# Patient Record
Sex: Female | Born: 1990 | Hispanic: No | Marital: Single
Health system: Southern US, Community
[De-identification: ages and names within clinical notes are randomized; demographics above are authoritative.]

## PROBLEM LIST (undated history)

## (undated) DIAGNOSIS — K219 Gastro-esophageal reflux disease without esophagitis: Secondary | ICD-10-CM

## (undated) DIAGNOSIS — Z8711 Personal history of peptic ulcer disease: Secondary | ICD-10-CM

## (undated) DIAGNOSIS — E559 Vitamin D deficiency, unspecified: Secondary | ICD-10-CM

## (undated) DIAGNOSIS — M549 Dorsalgia, unspecified: Secondary | ICD-10-CM

## (undated) DIAGNOSIS — F419 Anxiety disorder, unspecified: Secondary | ICD-10-CM

## (undated) DIAGNOSIS — E538 Deficiency of other specified B group vitamins: Secondary | ICD-10-CM

## (undated) HISTORY — DX: Anxiety disorder, unspecified: F41.9

## (undated) HISTORY — DX: Personal history of peptic ulcer disease: Z87.11

## (undated) HISTORY — DX: Vitamin D deficiency, unspecified: E55.9

## (undated) HISTORY — DX: Dorsalgia, unspecified: M54.9

## (undated) HISTORY — DX: Gastro-esophageal reflux disease without esophagitis: K21.9

## (undated) HISTORY — DX: Deficiency of other specified B group vitamins: E53.8

---
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
## 2010-05-10 ENCOUNTER — Encounter: Admission: RE | Admit: 2010-05-10 | Discharge: 2010-05-10 | Payer: Self-pay | Admitting: Family Medicine

## 2015-10-31 ENCOUNTER — Other Ambulatory Visit (HOSPITAL_COMMUNITY)
Admission: RE | Admit: 2015-10-31 | Discharge: 2015-10-31 | Disposition: A | Discharge status: Home / Self Care | Payer: Managed Care, Other (non HMO) | Source: Ambulatory Visit | Attending: Obstetrics and Gynecology | Admitting: Obstetrics and Gynecology

## 2015-10-31 ENCOUNTER — Other Ambulatory Visit: Payer: Self-pay | Admitting: Obstetrics and Gynecology

## 2015-10-31 DIAGNOSIS — Z01419 Encounter for gynecological examination (general) (routine) without abnormal findings: Secondary | ICD-10-CM | POA: Diagnosis not present

## 2015-11-07 LAB — CYTOLOGY - PAP

## 2017-07-13 ENCOUNTER — Other Ambulatory Visit: Payer: Self-pay | Admitting: Obstetrics and Gynecology

## 2017-07-13 ENCOUNTER — Other Ambulatory Visit (HOSPITAL_COMMUNITY)
Admission: RE | Admit: 2017-07-13 | Discharge: 2017-07-13 | Disposition: A | Discharge status: Home / Self Care | Payer: 59 | Source: Ambulatory Visit | Attending: Obstetrics and Gynecology | Admitting: Obstetrics and Gynecology

## 2017-07-13 DIAGNOSIS — Z01419 Encounter for gynecological examination (general) (routine) without abnormal findings: Secondary | ICD-10-CM | POA: Diagnosis present

## 2017-07-15 LAB — CYTOLOGY - PAP: Diagnosis: NEGATIVE

## 2020-03-14 ENCOUNTER — Ambulatory Visit: Payer: Self-pay | Attending: Internal Medicine

## 2020-03-16 ENCOUNTER — Ambulatory Visit: Admission: EM | Admit: 2020-03-16 | Discharge: 2020-03-16 | Discharge status: Absent without leave | Payer: Self-pay

## 2020-03-16 ENCOUNTER — Ambulatory Visit
Admission: EM | Admit: 2020-03-16 | Discharge: 2020-03-16 | Disposition: A | Discharge status: Home / Self Care | Payer: Federal, State, Local not specified - PPO

## 2020-03-16 ENCOUNTER — Encounter: Payer: Self-pay | Admitting: Emergency Medicine

## 2020-03-16 ENCOUNTER — Other Ambulatory Visit: Payer: Self-pay

## 2020-03-16 DIAGNOSIS — S00451A Superficial foreign body of right ear, initial encounter: Secondary | ICD-10-CM | POA: Diagnosis not present

## 2020-03-16 NOTE — ED Triage Notes (Signed)
Pt has earring stuck in right ear since last night.

## 2020-03-16 NOTE — Discharge Instructions (Signed)
Earring removed from LT ear lobe Wash with warm water and mild soap Bandage applied Use OTC ibuprofen and/ or tylenol as needed for pain control Follow up with PCP if symptoms persists Return here or go to the ER if you have any new or worsening symptoms fever, chills, nausea, vomiting, redness, swelling, discharge, etc..Marland Kitchen

## 2020-03-16 NOTE — ED Provider Notes (Signed)
[facility]   [id number] 03/16/20 Arrival Time: 8338  CC: EAR PAIN  SUBJECTIVE: History from: patient.  Tracie Newman is a 29 y.o. female who presents with of RT ear lobe pain and swelling x 1 day.  Got ears pierced 1 week ago.  Patient has tried removing earring at home without relief.  Symptoms are made worse with lying down.  Reports similar symptoms in the past and had to remove earring.    Denies fever, chills, fatigue, ear discharge, chest pain, nausea, changes in bowel or bladder habits.    ROS: As per HPI.  All other pertinent ROS negative.     History reviewed. No pertinent past medical history. History reviewed. No pertinent surgical history. No Known Allergies No current facility-administered medications on file prior to encounter.   Current Outpatient Medications on File Prior to Encounter  Medication Sig Dispense Refill  . cetirizine (ZYRTEC) 10 MG chewable tablet Chew 10 mg by mouth daily.    . sertraline (ZOLOFT) 100 MG tablet Take 100 mg by mouth daily.     Social History   Socioeconomic History  . Marital status: Single    Spouse name: Not on file  . Number of children: Not on file  . Years of education: Not on file  . Highest education level: Not on file  Occupational History  . Not on file  Tobacco Use  . Smoking status: Not on file  Substance and Sexual Activity  . Alcohol use: Not on file  . Drug use: Not on file  . Sexual activity: Not on file  Other Topics Concern  . Not on file  Social History Narrative  . Not on file   Social Determinants of Health   Financial Resource Strain:   . Difficulty of Paying Living Expenses:   Food Insecurity:   . Worried About Programme researcher, broadcasting/film/video in the Last Year:   . Barista in the Last Year:   Transportation Needs:   . Freight forwarder (Medical):   Marland Kitchen Lack of Transportation (Non-Medical):   Physical Activity:   . Days of Exercise per Week:   . Minutes of Exercise per Session:     Stress:   . Feeling of Stress :   Social Connections:   . Frequency of Communication with Friends and Family:   . Frequency of Social Gatherings with Friends and Family:   . Attends Religious Services:   . Active Member of Clubs or Organizations:   . Attends Banker Meetings:   Marland Kitchen Marital Status:   Intimate Partner Violence:   . Fear of Current or Ex-Partner:   . Emotionally Abused:   Marland Kitchen Physically Abused:   . Sexually Abused:    History reviewed. No pertinent family history.  OBJECTIVE:  Vitals:   03/16/20 0830  BP: 126/85  Pulse: 79  Resp: 16  Temp: 98 F (36.7 C)  SpO2: 96%    General appearance: alert; well-appearing, nontoxic; speaking in full sentences and tolerating own secretions HEENT: NCAT; Ears: Small metal stud earring with surrounding swelling to LT ear lobe, TTP, no obvious erythema; Eyes: EOM grossly intact Neck: supple Lungs: normal respiratory effort  Heart: regular rate and rhythm.  Skin: warm and dry Psychological: alert and cooperative; normal mood and affect   ASSESSMENT & PLAN:  1. Foreign body of right ear lobe, initial encounter    Earring removed from LT ear lobe Wash with warm water and mild soap Bandage applied  Use OTC ibuprofen and/ or tylenol as needed for pain control Follow up with PCP if symptoms persists Return here or go to the ER if you have any new or worsening symptoms fever, chills, nausea, vomiting, redness, swelling, discharge, etc...  Reviewed expectations re: course of current medical issues. Questions answered. Outlined signs and symptoms indicating need for more acute intervention. Patient verbalized understanding. After Visit Summary given.         Stacey Drain Navarino, PA-C 03/16/20 [phone]

## 2020-03-19 ENCOUNTER — Other Ambulatory Visit: Payer: Self-pay

## 2020-03-19 ENCOUNTER — Ambulatory Visit: Admission: EM | Admit: 2020-03-19 | Discharge: 2020-03-19 | Discharge status: Absent without leave | Payer: Self-pay

## 2020-03-19 ENCOUNTER — Ambulatory Visit
Admission: EM | Admit: 2020-03-19 | Discharge: 2020-03-19 | Disposition: A | Discharge status: Home / Self Care | Payer: Federal, State, Local not specified - PPO

## 2020-03-19 DIAGNOSIS — H9202 Otalgia, left ear: Secondary | ICD-10-CM

## 2020-03-19 DIAGNOSIS — T162XXA Foreign body in left ear, initial encounter: Secondary | ICD-10-CM | POA: Diagnosis not present

## 2020-03-19 NOTE — ED Triage Notes (Signed)
Pt has earring stuck in left ear from recent piercing. Had same on right ear 3 days ago.

## 2020-03-19 NOTE — ED Provider Notes (Signed)
[facility]    CSN: [id number] Arrival date & time: 03/19/20  0810      History   Chief Complaint Chief Complaint  Patient presents with  . Foreign Body in Skin    HPI Tracie Newman is a 29 y.o. female.   Who presented to the urgent care with a complaint of left earlobe pain and swelling for the past 3 to 4 days.  Got earpiece 9 days ago.  Patient has tried removing the earpiece at home without relief.  Symptoms are made worse with lying down on the left side.  Reports similar symptoms in the past and had ear piece removed.  Denies chills, fever, nausea, vomiting, diarrhea.  The history is provided by the patient. No language interpreter was used.    No past medical history on file.  There are no problems to display for this patient.   No past surgical history on file.  OB History   No obstetric history on file.      Home Medications    Prior to Admission medications   Medication Sig Start Date End Date Taking? Authorizing Provider  cetirizine (ZYRTEC) 10 MG chewable tablet Chew 10 mg by mouth daily.    [provider]  sertraline (ZOLOFT) 100 MG tablet Take 100 mg by mouth daily.    [provider]    Family History No family history on file.  Social History Social History   Tobacco Use  . Smoking status: Not on file  Substance Use Topics  . Alcohol use: Not on file  . Drug use: Not on file     Allergies   Patient has no known allergies.   Review of Systems Review of Systems  Respiratory: Negative.   Cardiovascular: Negative.   Skin: Positive for color change.       Part of hearing lodge inside left ear lobe  All other systems reviewed and are negative.    Physical Exam Triage Vital Signs ED Triage Vitals [03/19/20 0818]  Enc Vitals Group     BP 137/82     Pulse Rate 80     Resp 16     Temp 97.9 F (36.6 C)     Temp src      SpO2 98 %     Weight      Height      Head Circumference      Peak Flow      Pain Score 4     Pain Loc      Pain Edu?      Excl. in [place]?    No data found.  Updated Vital Signs BP 137/82   Pulse 80   Temp 97.9 F (36.6 C)   Resp 16   LMP 03/05/2020   SpO2 98%   Visual Acuity Right Eye Distance:   Left Eye Distance:   Bilateral Distance:    Right Eye Near:   Left Eye Near:    Bilateral Near:     Physical Exam Vitals and nursing note reviewed.  Constitutional:      General: She is not in acute distress.    Appearance: Normal appearance. She is normal weight. She is not ill-appearing, toxic-appearing or diaphoretic.  Cardiovascular:     Rate and Rhythm: Normal rate and regular rhythm.     Pulses: Normal pulses.     Heart sounds: Normal heart sounds.  Pulmonary:     Effort: Pulmonary effort is normal. No respiratory distress.  Breath sounds: Normal breath sounds. No stridor. No wheezing, rhonchi or rales.  Skin:    General: Skin is warm.     Coloration: Skin is not pale.     Findings: No erythema.     Comments: Earpiece removed from the left earlobe  Neurological:     Mental Status: She is alert.      UC Treatments / Results  Labs (all labs ordered are listed, but only abnormal results are displayed) Labs Reviewed - No data to display  EKG   Radiology No results found.  Procedures Procedures (including critical care time)  Medications Ordered in UC Medications - No data to display  Initial Impression / Assessment and Plan / UC Course  I have reviewed the triage vital signs and the nursing notes.  Pertinent labs & imaging results that were available during my care of the patient were reviewed by me and considered in my medical decision making (see chart for details).    Patient stable at discharge.  Earpiece was removed by gentle moving outward.  Patient tolerated well.  No bleeding  Final Clinical Impressions(s) / UC Diagnoses   Final diagnoses:  Acute foreign body of left earlobe, initial encounter  Pain of left  earlobe     Discharge Instructions     Earring removed from left earlobe Wash with warm water and mild soap Bandage applied May take OTC ibuprofen/Tylenol as needed for pain Follow-up with PCP Return here or go to ED for worsening symptoms such as fever, chills, nausea, vomiting, diarrhea, redness swelling.    ED Prescriptions    None     PDMP not reviewed this encounter.   Durward Parcel, FNP 03/19/20 [phone]

## 2020-03-19 NOTE — Discharge Instructions (Addendum)
Earring removed from left earlobe Wash with warm water and mild soap Bandage applied May take OTC ibuprofen/Tylenol as needed for pain Follow-up with PCP Return here or go to ED for worsening symptoms such as fever, chills, nausea, vomiting, diarrhea, redness swelling.

## 2020-04-16 DIAGNOSIS — M542 Cervicalgia: Secondary | ICD-10-CM | POA: Diagnosis not present

## 2020-04-16 DIAGNOSIS — M62830 Muscle spasm of back: Secondary | ICD-10-CM | POA: Diagnosis not present

## 2020-04-16 DIAGNOSIS — M9902 Segmental and somatic dysfunction of thoracic region: Secondary | ICD-10-CM | POA: Diagnosis not present

## 2020-04-16 DIAGNOSIS — M9901 Segmental and somatic dysfunction of cervical region: Secondary | ICD-10-CM | POA: Diagnosis not present

## 2020-04-17 DIAGNOSIS — M62830 Muscle spasm of back: Secondary | ICD-10-CM | POA: Diagnosis not present

## 2020-04-17 DIAGNOSIS — M9902 Segmental and somatic dysfunction of thoracic region: Secondary | ICD-10-CM | POA: Diagnosis not present

## 2020-04-17 DIAGNOSIS — M542 Cervicalgia: Secondary | ICD-10-CM | POA: Diagnosis not present

## 2020-04-17 DIAGNOSIS — M9901 Segmental and somatic dysfunction of cervical region: Secondary | ICD-10-CM | POA: Diagnosis not present

## 2020-04-19 DIAGNOSIS — M62830 Muscle spasm of back: Secondary | ICD-10-CM | POA: Diagnosis not present

## 2020-04-19 DIAGNOSIS — M542 Cervicalgia: Secondary | ICD-10-CM | POA: Diagnosis not present

## 2020-04-19 DIAGNOSIS — M9902 Segmental and somatic dysfunction of thoracic region: Secondary | ICD-10-CM | POA: Diagnosis not present

## 2020-04-19 DIAGNOSIS — M9901 Segmental and somatic dysfunction of cervical region: Secondary | ICD-10-CM | POA: Diagnosis not present

## 2020-04-22 ENCOUNTER — Emergency Department (HOSPITAL_COMMUNITY)
Admission: EM | Admit: 2020-04-22 | Discharge: 2020-04-22 | Disposition: A | Discharge status: Home / Self Care | Payer: Federal, State, Local not specified - PPO | Attending: Emergency Medicine | Admitting: Emergency Medicine

## 2020-04-22 ENCOUNTER — Encounter (HOSPITAL_COMMUNITY): Payer: Self-pay | Admitting: Emergency Medicine

## 2020-04-22 ENCOUNTER — Emergency Department (HOSPITAL_COMMUNITY): Payer: Federal, State, Local not specified - PPO

## 2020-04-22 ENCOUNTER — Other Ambulatory Visit: Payer: Self-pay

## 2020-04-22 DIAGNOSIS — X501XXA Overexertion from prolonged static or awkward postures, initial encounter: Secondary | ICD-10-CM | POA: Insufficient documentation

## 2020-04-22 DIAGNOSIS — Y9301 Activity, walking, marching and hiking: Secondary | ICD-10-CM | POA: Insufficient documentation

## 2020-04-22 DIAGNOSIS — S93401A Sprain of unspecified ligament of right ankle, initial encounter: Secondary | ICD-10-CM | POA: Diagnosis not present

## 2020-04-22 DIAGNOSIS — Y929 Unspecified place or not applicable: Secondary | ICD-10-CM | POA: Insufficient documentation

## 2020-04-22 DIAGNOSIS — Y998 Other external cause status: Secondary | ICD-10-CM | POA: Diagnosis not present

## 2020-04-22 DIAGNOSIS — S99911A Unspecified injury of right ankle, initial encounter: Secondary | ICD-10-CM | POA: Diagnosis not present

## 2020-04-22 NOTE — ED Triage Notes (Signed)
Pt c/o of right ankle pain. Larey Seat today and "heard a pop" pain with ambulation

## 2020-04-22 NOTE — ED Provider Notes (Signed)
[facility] EMERGENCY DEPARTMENT Provider Note   CSN: [id number] Arrival date & time: 04/22/20  1328     History Chief Complaint  Patient presents with  . Ankle Pain    Tracie Newman is a 29 y.o. female.  HPI   29 year old female presenting for evaluation of right ankle pain.  States that she was walking today and twisted her ankle.  States she heard a pop and now has some swelling to the ankle and pain with ambulation.  She has no pain at rest but when she tries to put weight on it it hurts worse.  She denies any other injuries or pain elsewhere.  History reviewed. No pertinent past medical history.  There are no problems to display for this patient.   History reviewed. No pertinent surgical history.   OB History   No obstetric history on file.     No family history on file.  Social History   Tobacco Use  . Smoking status: Never Smoker  . Smokeless tobacco: Never Used  Substance Use Topics  . Alcohol use: Never  . Drug use: Never    Home Medications Prior to Admission medications   Medication Sig Start Date End Date Taking? Authorizing Provider  cetirizine (ZYRTEC) 10 MG chewable tablet Chew 10 mg by mouth daily.    [provider]  sertraline (ZOLOFT) 100 MG tablet Take 100 mg by mouth daily.    [provider]    Allergies    Patient has no known allergies.  Review of Systems   Review of Systems  Constitutional: Negative for fever.  Musculoskeletal:       Left ankle pain  Skin: Negative for wound.  Neurological: Negative for weakness and numbness.       No head trauma or loc    Physical Exam Updated Vital Signs BP 122/67   Pulse 95   Temp 99.1 F (37.3 C) (Oral)   Resp 17   Ht 5\' 9"  (1.753 m)   Wt 131.5 kg   SpO2 99%   BMI 42.83 kg/m   Physical Exam Vitals and nursing note reviewed.  Constitutional:      General: She is not in acute distress.    Appearance: She is well-developed.  HENT:     Head: Normocephalic  and atraumatic.  Eyes:     Conjunctiva/sclera: Conjunctivae normal.  Cardiovascular:     Rate and Rhythm: Normal rate.  Pulmonary:     Effort: Pulmonary effort is normal.  Musculoskeletal:        General: Normal range of motion.     Cervical back: Neck supple.     Comments: TTP and swelling to the left ankle at the medial malleolus and just anterior to this. No lateral malleolar TTP. No TTP to the foot or to the tib/fib. Normal sensation throughout. DP pulses 2+ and symmetric. Normal sensation.  Skin:    General: Skin is warm and dry.  Neurological:     Mental Status: She is alert.     ED Results / Procedures / Treatments   Labs (all labs ordered are listed, but only abnormal results are displayed) Labs Reviewed - No data to display  EKG None  Radiology DG Ankle Complete Right  Result Date: 04/22/2020 CLINICAL DATA:  Pain after fall EXAM: RIGHT ANKLE - COMPLETE 3+ VIEW COMPARISON:  None. FINDINGS: A soft tissue calcification projects over the distal tibial diaphysis of no acute significance. No acute fracture identified. IMPRESSION: No acute abnormality identified.  Electronically Signed   By: Dorise Bullion III M.D   On: 04/22/2020 14:31    Procedures Procedures (including critical care time)  Medications Ordered in ED Medications - No data to display  ED Course  I have reviewed the triage vital signs and the nursing notes.  Pertinent labs & imaging results that were available during my care of the patient were reviewed by me and considered in my medical decision making (see chart for details).    MDM Rules/Calculators/A&P                         Patient presenting with ankle pain after twisting ankle prior to arrival.  Vital signs stable and patient nontoxic-appearing.  X-ray of right ankle negative for acute fracture or abnormality.  A splint was applied and crutches given.  OrthO follow-up given and patient advised to follow-up with either PCP or orthopedics in 1  week for reevaluation.  Advised Tylenol, ibuprofen, and rice protocol for pain.  Advised to return to the ER for any new or worsening symptoms in the meantime.  All questions were answered and patient understands plan and reasons to return.  Final Clinical Impression(s) / ED Diagnoses Final diagnoses:  Sprain of right ankle, unspecified ligament, initial encounter    Rx / DC Orders ED Discharge Orders    None       Bishop Dublin 04/22/20 1507    Wyvonnia Dusky, MD 04/22/20 1902

## 2020-04-22 NOTE — Discharge Instructions (Signed)

## 2020-04-24 DIAGNOSIS — M542 Cervicalgia: Secondary | ICD-10-CM | POA: Diagnosis not present

## 2020-04-24 DIAGNOSIS — M9902 Segmental and somatic dysfunction of thoracic region: Secondary | ICD-10-CM | POA: Diagnosis not present

## 2020-04-24 DIAGNOSIS — M62830 Muscle spasm of back: Secondary | ICD-10-CM | POA: Diagnosis not present

## 2020-04-24 DIAGNOSIS — S93401A Sprain of unspecified ligament of right ankle, initial encounter: Secondary | ICD-10-CM | POA: Diagnosis not present

## 2020-04-24 DIAGNOSIS — M25571 Pain in right ankle and joints of right foot: Secondary | ICD-10-CM | POA: Diagnosis not present

## 2020-04-24 DIAGNOSIS — M9901 Segmental and somatic dysfunction of cervical region: Secondary | ICD-10-CM | POA: Diagnosis not present

## 2020-04-26 DIAGNOSIS — M62830 Muscle spasm of back: Secondary | ICD-10-CM | POA: Diagnosis not present

## 2020-04-26 DIAGNOSIS — M9902 Segmental and somatic dysfunction of thoracic region: Secondary | ICD-10-CM | POA: Diagnosis not present

## 2020-04-26 DIAGNOSIS — M542 Cervicalgia: Secondary | ICD-10-CM | POA: Diagnosis not present

## 2020-04-26 DIAGNOSIS — M9901 Segmental and somatic dysfunction of cervical region: Secondary | ICD-10-CM | POA: Diagnosis not present

## 2020-05-03 DIAGNOSIS — M25571 Pain in right ankle and joints of right foot: Secondary | ICD-10-CM | POA: Diagnosis not present

## 2020-05-20 DIAGNOSIS — R42 Dizziness and giddiness: Secondary | ICD-10-CM | POA: Diagnosis not present

## 2020-05-20 DIAGNOSIS — F419 Anxiety disorder, unspecified: Secondary | ICD-10-CM | POA: Diagnosis not present

## 2020-05-21 DIAGNOSIS — M542 Cervicalgia: Secondary | ICD-10-CM | POA: Diagnosis not present

## 2020-05-21 DIAGNOSIS — M62830 Muscle spasm of back: Secondary | ICD-10-CM | POA: Diagnosis not present

## 2020-05-21 DIAGNOSIS — M9902 Segmental and somatic dysfunction of thoracic region: Secondary | ICD-10-CM | POA: Diagnosis not present

## 2020-05-21 DIAGNOSIS — M9901 Segmental and somatic dysfunction of cervical region: Secondary | ICD-10-CM | POA: Diagnosis not present

## 2020-05-22 DIAGNOSIS — M62830 Muscle spasm of back: Secondary | ICD-10-CM | POA: Diagnosis not present

## 2020-05-22 DIAGNOSIS — M542 Cervicalgia: Secondary | ICD-10-CM | POA: Diagnosis not present

## 2020-05-22 DIAGNOSIS — M9901 Segmental and somatic dysfunction of cervical region: Secondary | ICD-10-CM | POA: Diagnosis not present

## 2020-05-22 DIAGNOSIS — M9902 Segmental and somatic dysfunction of thoracic region: Secondary | ICD-10-CM | POA: Diagnosis not present

## 2020-05-24 DIAGNOSIS — M542 Cervicalgia: Secondary | ICD-10-CM | POA: Diagnosis not present

## 2020-05-24 DIAGNOSIS — M9901 Segmental and somatic dysfunction of cervical region: Secondary | ICD-10-CM | POA: Diagnosis not present

## 2020-05-24 DIAGNOSIS — M62830 Muscle spasm of back: Secondary | ICD-10-CM | POA: Diagnosis not present

## 2020-05-24 DIAGNOSIS — M9902 Segmental and somatic dysfunction of thoracic region: Secondary | ICD-10-CM | POA: Diagnosis not present

## 2020-06-04 DIAGNOSIS — M9902 Segmental and somatic dysfunction of thoracic region: Secondary | ICD-10-CM | POA: Diagnosis not present

## 2020-06-04 DIAGNOSIS — M62830 Muscle spasm of back: Secondary | ICD-10-CM | POA: Diagnosis not present

## 2020-06-04 DIAGNOSIS — M9901 Segmental and somatic dysfunction of cervical region: Secondary | ICD-10-CM | POA: Diagnosis not present

## 2020-06-04 DIAGNOSIS — M542 Cervicalgia: Secondary | ICD-10-CM | POA: Diagnosis not present

## 2020-06-12 DIAGNOSIS — M9902 Segmental and somatic dysfunction of thoracic region: Secondary | ICD-10-CM | POA: Diagnosis not present

## 2020-06-12 DIAGNOSIS — M542 Cervicalgia: Secondary | ICD-10-CM | POA: Diagnosis not present

## 2020-06-12 DIAGNOSIS — M62830 Muscle spasm of back: Secondary | ICD-10-CM | POA: Diagnosis not present

## 2020-06-12 DIAGNOSIS — M9901 Segmental and somatic dysfunction of cervical region: Secondary | ICD-10-CM | POA: Diagnosis not present

## 2020-06-13 DIAGNOSIS — M542 Cervicalgia: Secondary | ICD-10-CM | POA: Diagnosis not present

## 2020-06-13 DIAGNOSIS — M9902 Segmental and somatic dysfunction of thoracic region: Secondary | ICD-10-CM | POA: Diagnosis not present

## 2020-06-13 DIAGNOSIS — M62830 Muscle spasm of back: Secondary | ICD-10-CM | POA: Diagnosis not present

## 2020-06-13 DIAGNOSIS — M9901 Segmental and somatic dysfunction of cervical region: Secondary | ICD-10-CM | POA: Diagnosis not present

## 2020-07-02 DIAGNOSIS — K080 Exfoliation of teeth due to systemic causes: Secondary | ICD-10-CM | POA: Diagnosis not present

## 2020-07-04 DIAGNOSIS — N898 Other specified noninflammatory disorders of vagina: Secondary | ICD-10-CM | POA: Diagnosis not present

## 2020-07-04 DIAGNOSIS — L309 Dermatitis, unspecified: Secondary | ICD-10-CM | POA: Diagnosis not present

## 2020-07-24 DIAGNOSIS — M545 Low back pain: Secondary | ICD-10-CM | POA: Diagnosis not present

## 2020-07-24 DIAGNOSIS — M542 Cervicalgia: Secondary | ICD-10-CM | POA: Diagnosis not present

## 2020-07-24 DIAGNOSIS — Z6841 Body Mass Index (BMI) 40.0 and over, adult: Secondary | ICD-10-CM | POA: Diagnosis not present

## 2020-08-02 DIAGNOSIS — M25571 Pain in right ankle and joints of right foot: Secondary | ICD-10-CM | POA: Diagnosis not present

## 2020-08-09 DIAGNOSIS — M545 Low back pain, unspecified: Secondary | ICD-10-CM | POA: Diagnosis not present

## 2020-08-09 DIAGNOSIS — M542 Cervicalgia: Secondary | ICD-10-CM | POA: Diagnosis not present

## 2020-08-16 ENCOUNTER — Ambulatory Visit (INDEPENDENT_AMBULATORY_CARE_PROVIDER_SITE_OTHER): Payer: Federal, State, Local not specified - PPO | Admitting: Family Medicine

## 2020-08-16 ENCOUNTER — Encounter (INDEPENDENT_AMBULATORY_CARE_PROVIDER_SITE_OTHER): Payer: Self-pay | Admitting: Family Medicine

## 2020-08-16 ENCOUNTER — Other Ambulatory Visit: Payer: Self-pay

## 2020-08-16 VITALS — BP 119/78 | HR 82 | Temp 98.6°F | Ht 69.0 in | Wt 293.0 lb

## 2020-08-16 DIAGNOSIS — Z6841 Body Mass Index (BMI) 40.0 and over, adult: Secondary | ICD-10-CM | POA: Insufficient documentation

## 2020-08-16 DIAGNOSIS — E538 Deficiency of other specified B group vitamins: Secondary | ICD-10-CM | POA: Diagnosis not present

## 2020-08-16 DIAGNOSIS — Z9189 Other specified personal risk factors, not elsewhere classified: Secondary | ICD-10-CM | POA: Diagnosis not present

## 2020-08-16 DIAGNOSIS — Z8711 Personal history of peptic ulcer disease: Secondary | ICD-10-CM | POA: Insufficient documentation

## 2020-08-16 DIAGNOSIS — R5383 Other fatigue: Secondary | ICD-10-CM | POA: Diagnosis not present

## 2020-08-16 DIAGNOSIS — R0602 Shortness of breath: Secondary | ICD-10-CM

## 2020-08-16 DIAGNOSIS — F39 Unspecified mood [affective] disorder: Secondary | ICD-10-CM | POA: Diagnosis not present

## 2020-08-16 DIAGNOSIS — Z0289 Encounter for other administrative examinations: Secondary | ICD-10-CM

## 2020-08-16 DIAGNOSIS — E559 Vitamin D deficiency, unspecified: Secondary | ICD-10-CM | POA: Insufficient documentation

## 2020-08-16 DIAGNOSIS — K219 Gastro-esophageal reflux disease without esophagitis: Secondary | ICD-10-CM | POA: Diagnosis not present

## 2020-08-16 DIAGNOSIS — F3289 Other specified depressive episodes: Secondary | ICD-10-CM

## 2020-08-17 DIAGNOSIS — M545 Low back pain, unspecified: Secondary | ICD-10-CM | POA: Diagnosis not present

## 2020-08-17 DIAGNOSIS — M542 Cervicalgia: Secondary | ICD-10-CM | POA: Diagnosis not present

## 2020-08-17 LAB — COMPREHENSIVE METABOLIC PANEL
ALT: 12 IU/L (ref 0–32)
AST: 11 IU/L (ref 0–40)
Albumin/Globulin Ratio: 1.2 (ref 1.2–2.2)
Albumin: 4.1 g/dL (ref 3.9–5.0)
Alkaline Phosphatase: 103 IU/L (ref 44–121)
BUN/Creatinine Ratio: 15 (ref 9–23)
BUN: 10 mg/dL (ref 6–20)
Bilirubin Total: 0.3 mg/dL (ref 0.0–1.2)
CO2: 23 mmol/L (ref 20–29)
Calcium: 9.2 mg/dL (ref 8.7–10.2)
Chloride: 99 mmol/L (ref 96–106)
Creatinine, Ser: 0.66 mg/dL (ref 0.57–1.00)
GFR calc Af Amer: 139 mL/min/{1.73_m2} (ref >59)
GFR calc non Af Amer: 121 mL/min/{1.73_m2} (ref >59)
Globulin, Total: 3.3 g/dL (ref 1.5–4.5)
Glucose: 86 mg/dL (ref 65–99)
Potassium: 4.4 mmol/L (ref 3.5–5.2)
Sodium: 135 mmol/L (ref 134–144)
Total Protein: 7.4 g/dL (ref 6.0–8.5)

## 2020-08-17 LAB — CBC WITH DIFFERENTIAL/PLATELET
Basophils Absolute: 0 10*3/uL (ref 0.0–0.2)
Basos: 0 %
EOS (ABSOLUTE): 0.1 10*3/uL (ref 0.0–0.4)
Eos: 1 %
Hematocrit: 40.7 % (ref 34.0–46.6)
Hemoglobin: 13.1 g/dL (ref 11.1–15.9)
Immature Grans (Abs): 0 10*3/uL (ref 0.0–0.1)
Immature Granulocytes: 0 %
Lymphocytes Absolute: 2.7 10*3/uL (ref 0.7–3.1)
Lymphs: 35 %
MCH: 28.2 pg (ref 26.6–33.0)
MCHC: 32.2 g/dL (ref 31.5–35.7)
MCV: 88 fL (ref 79–97)
Monocytes Absolute: 0.4 10*3/uL (ref 0.1–0.9)
Monocytes: 5 %
Neutrophils Absolute: 4.4 10*3/uL (ref 1.4–7.0)
Neutrophils: 59 %
Platelets: 267 10*3/uL (ref 150–450)
RBC: 4.64 x10E6/uL (ref 3.77–5.28)
RDW: 13.4 % (ref 11.7–15.4)
WBC: 7.5 10*3/uL (ref 3.4–10.8)

## 2020-08-17 LAB — LIPID PANEL
Chol/HDL Ratio: 3.1 ratio (ref 0.0–4.4)
Cholesterol, Total: 156 mg/dL (ref 100–199)
HDL: 50 mg/dL (ref >39)
LDL Chol Calc (NIH): 91 mg/dL (ref 0–99)
Triglycerides: 75 mg/dL (ref 0–149)
VLDL Cholesterol Cal: 15 mg/dL (ref 5–40)

## 2020-08-17 LAB — FOLATE: Folate: 5 ng/mL (ref >3.0)

## 2020-08-17 LAB — T3: T3, Total: 141 ng/dL (ref 71–180)

## 2020-08-17 LAB — TSH: TSH: 2.8 u[IU]/mL (ref 0.450–4.500)

## 2020-08-17 LAB — T4: T4, Total: 6.8 ug/dL (ref 4.5–12.0)

## 2020-08-17 LAB — HEMOGLOBIN A1C
Est. average glucose Bld gHb Est-mCnc: 108 mg/dL
Hgb A1c MFr Bld: 5.4 % (ref 4.8–5.6)

## 2020-08-17 LAB — INSULIN, RANDOM: INSULIN: 15.5 u[IU]/mL (ref 2.6–24.9)

## 2020-08-17 LAB — VITAMIN B12: Vitamin B-12: 666 pg/mL (ref 232–1245)

## 2020-08-17 LAB — VITAMIN D 25 HYDROXY (VIT D DEFICIENCY, FRACTURES): Vit D, 25-Hydroxy: 33.3 ng/mL (ref 30.0–100.0)

## 2020-08-21 NOTE — Progress Notes (Signed)
Dear Tracie Folks Ward, PA-C,   Thank you for referring Tracie Newman to our clinic. The following note includes my evaluation and treatment recommendations.  Chief Complaint:   OBESITY Tracie Newman (MR# [id number]) is a 29 y.o. female who presents for evaluation and treatment of obesity and related comorbidities. Current BMI is Body mass index is 43.27 kg/m. Tracie Newman has been struggling with her weight for many years and has been unsuccessful in either losing weight, maintaining weight loss, or reaching her healthy weight goal.  Tracie Newman is currently in the action stage of change and ready to dedicate time achieving and maintaining a healthier weight. Tracie Newman is interested in becoming our patient and working on intensive lifestyle modifications including (but not limited to) diet and exercise for weight loss.  Tracie Newman is single and lives with her 46 year old mother.  She works full time as an Pensions consultant.  She was at [facility] for back pain and Dr. Shon Baton referred her here.  Tracie Newman's habits were reviewed today and are as follows: Her family eats meals together, she thinks her family will eat healthier with her, her desired weight loss is 105 lbs, she has been heavy most of her life, she started gaining excessive weight within the last couple of years, her heaviest weight ever was 298 pounds, she craves sugar and fast food, she skips breakfast frequently, she is frequently drinking liquids with calories, she frequently makes poor food choices, she frequently eats larger portions than normal and she struggles with emotional eating.  This is the patient's first visit at [facility].  The patient's NEW PATIENT PACKET that they filled out prior to today's office visit was reviewed at length and information from that paperwork was included within the following office visit note.    Included in the packet: current and past health history, medications, allergies, ROS, gynecologic history (women  only), surgical history, family history, social history, weight history, weight loss surgery history (for those that have had weight loss surgery), nutritional evaluation, mood and food questionnaire along with a depression screening (PHQ9) on all patients, an Epworth questionnaire, sleep habits questionnaire, patient life and health improvement goals questionnaire. These will all be scanned into the patient's chart under media.   During the visit, I independently reviewed the patient's EKG, bioimpedance scale results, and indirect calorimeter results. I used this information to tailor a meal plan for the patient that will help Tracie Newman to lose weight and will improve her obesity-related conditions going forward.  I performed a medically necessary appropriate examination and/or evaluation. I discussed the assessment and treatment plan with the patient. The patient was provided an opportunity to ask questions and all were answered. The patient agreed with the plan and demonstrated an understanding of the instructions. Labs were ordered today (unless patient declined them) and will be reviewed with the patient at our next visit unless more critical results need to be addressed immediately. Clinical information was updated and documented in the EMR.  Time spent on visit including pre-visit chart review and post-visit care was estimated to be 60-74 minutes.  A separate 15 minutes was spent on risk counseling (see above/below).   Depression Screen Tracie Newman Food and Mood (modified PHQ-9) score was 8.  Depression screen PHQ 2/9 08/16/2020  Decreased Interest 1  Down, Depressed, Hopeless 0  PHQ - 2 Score 1  Altered sleeping 0  Tired, decreased energy 3  Change in appetite 2  Feeling bad or failure about yourself  1  Trouble concentrating 1  Moving slowly or fidgety/restless 0  Suicidal thoughts 0  PHQ-9 Score 8  Difficult doing work/chores Not difficult at all   Assessment/Plan:   Orders Placed This  Encounter  Procedures  . Vitamin B12  . CBC with Differential/Platelet  . Comprehensive metabolic panel  . Folate  . Hemoglobin A1c  . Insulin, random  . Lipid panel  . T3  . T4  . TSH  . VITAMIN D 25 Hydroxy (Vit-D Deficiency, Fractures)  . EKG 12-Lead    1. Other fatigue Tracie Newman denies daytime somnolence and admits to waking up still tired. Patent has a history of symptoms of morning fatigue and snoring. Tracie Newman generally gets 4 or 5 hours of sleep per night, and states that she has poor quality sleep. Snoring is present. Apneic episodes are not present. Epworth Sleepiness Score is 6.  Tracie Newman does feel that her weight is causing her energy to be lower than it should be. Fatigue may be related to obesity, depression or many other causes. Labs will be ordered, and in the meanwhile, Tracie Newman will focus on self care including making healthy food choices, increasing physical activity and focusing on stress reduction.  - EKG 12-Lead - Vitamin B12 - CBC with Differential/Platelet - Comprehensive metabolic panel - Folate - Hemoglobin A1c - Insulin, random - Lipid panel - T3 - T4 - TSH - VITAMIN D 25 Hydroxy (Vit-D Deficiency, Fractures)    2. Shortness of breath on exertion Tracie Newman notes increasing shortness of breath with exercising and seems to be worsening over time with weight gain. She notes getting out of breath sooner with activity than she used to. This has gotten worse recently. Tracie Newman denies shortness of breath at rest or orthopnea.  Tracie Newman does feel that she gets out of breath more easily that she used to when she exercises. Tracie Newman's shortness of breath appears to be obesity related and exercise induced. She has agreed to work on weight loss and gradually increase exercise to treat her exercise induced shortness of breath. Will continue to monitor closely.  - Vitamin B12 - CBC with Differential/Platelet - Comprehensive metabolic panel - Folate - Hemoglobin A1c - Insulin,  random - Lipid panel - T3 - T4 - TSH - VITAMIN D 25 Hydroxy (Vit-D Deficiency, Fractures)    3. Gastroesophageal reflux disease, unspecified whether esophagitis present Tracie Newman has a history of gastric ulcers.  She is not currently taking any medication for this.  Plan:  Intensive lifestyle modifications are the first line treatment for this issue. We discussed several lifestyle modifications today and she will continue to work on diet, exercise and weight loss efforts. Orders and follow up as documented in patient record.   Counseling . If a person has gastroesophageal reflux disease (GERD), food and stomach acid move back up into the esophagus and cause symptoms or problems such as damage to the esophagus. . Anti-reflux measures include: raising the head of the bed, avoiding tight clothing or belts, avoiding eating late at night, not lying down shortly after mealtime, and achieving weight loss. . Avoid ASA, NSAID's, caffeine, alcohol, and tobacco.  . OTC Pepcid and/or Tums are often very helpful for as needed use.  Marland Kitchen. However, for persisting chronic or daily symptoms, stronger medications like Omeprazole may be needed. . You may need to avoid foods and drinks such as: ? Coffee and tea (with or without caffeine). ? Drinks that contain alcohol. ? Energy drinks and sports drinks. ? Bubbly (carbonated) drinks or sodas. ?  Chocolate and cocoa. ? Peppermint and mint flavorings. ? Garlic and onions. ? Horseradish. ? Spicy and acidic foods. These include peppers, chili powder, curry powder, vinegar, hot sauces, and BBQ sauce. ? Citrus fruit juices and citrus fruits, such as oranges, lemons, and limes. ? Tomato-based foods. These include red sauce, chili, salsa, and pizza with red sauce. ? Fried and fatty foods. These include donuts, french fries, potato chips, and high-fat dressings. ? High-fat meats. These include hot dogs, rib eye steak, sausage, ham, and bacon.  - CBC with  Differential/Platelet - Comprehensive metabolic panel    4. Vitamin D deficiency Tracie Newman has a history of Vitamin D deficiency with resultant generalized fatigue as her primary symptom.  she is taking no vitamin D supplement for this deficiency and tolerating it well without side-effect.    Plan:   - Discussed importance of vitamin D (as well as calcium) to their health and well-being.   - We reviewed possible symptoms of low Vitamin D including low energy, depressed mood, muscle aches, joint aches, osteoporosis etc.  - We discussed that low Vitamin D levels may be linked to an increased risk of cardiovascular events and even increased risk of cancers- such as colon and breast.   - Educated pt that weight loss will likely improve availability of vitamin D, thus encouraged Tracie Newman to continue with meal plan and their weight loss efforts to further improve this condition   - Informed patient this may be a lifelong thing, and she was encouraged to continue to take the medicine until pt told otherwise.   We will need to monitor levels regularly ( q 3-4 mo on average )  to keep levels within normal limits.   - All pt's questions and concerns regarding this condition addressed.  -Check vitamin D level today.  - VITAMIN D 25 Hydroxy (Vit-D Deficiency, Fractures)    5. B12 deficiency She notes fatigue. She is not a vegetarian.  She does not have a previous diagnosis of pernicious anemia.  She does not have a history of weight loss surgery.  She is not taking a B12 supplement at this time.  Plan:  The diagnosis was reviewed with the patient. Counseling provided today, see below. We will continue to monitor. Orders and follow up as documented in patient record.  Will check vitamin B12 level today.  Counseling . The body needs vitamin B12: to make red blood cells; to make DNA; and to help the nerves work properly so they can carry messages from the brain to the body.  . The main causes  of vitamin B12 deficiency include dietary deficiency, digestive diseases, pernicious anemia, and having a surgery in which part of the stomach or small intestine is removed.  . Certain medicines can make it harder for the body to absorb vitamin B12. These medicines include: heartburn medications; some antibiotics; some medications used to treat diabetes, gout, and high cholesterol.  . In some cases, there are no symptoms of this condition. If the condition leads to anemia or nerve damage, various symptoms can occur, such as weakness or fatigue, shortness of breath, and numbness or tingling in your hands and feet.   . Treatment:  o May include taking vitamin B12 supplements.  o Avoid alcohol.  o Eat lots of healthy foods that contain vitamin B12: - Beef, pork, chicken, [place], and organ meats, such as liver.  - Seafood: This includes clams, rainbow trout, salmon, tuna, and haddock. Eggs.  - Cereal and dairy  products that are fortified: This means that vitamin B12 has been added to the food.   Lab Results  Component Value Date   VITAMINB12 666 08/16/2020   - Vitamin B12    6. Other depression, with emotional eating With anxiety.  She is taking Zoloft 100 mg daily.  Denies SI/HI.  PHQ-9 is 8.   Plan:    - Will check labs today.  - Continue Zoloft at current dose via PCP.  - Counseled patient on pathophysiology of disease and discussed various treatment options, which often includes dietary and lifestyle modifications as first line, in addition to discussing the risks and benefits of various medications.   - In addition to possible prescription interventions, reviewed the "spokes of the wheel" of mood and health management.   Stressed the importance of ongoing prudent health habits, including regular exercise, appropriate sleep hygiene, healthy dietary habits, prayer/ meditation to help with mood stability and seeking the help of a professional counselor discussed.    - Encouraged  patient to work on simplifying life, setting better boundaries with others and minimizing sources of stress   - Vitamin B12 - CBC with Differential/Platelet - Comprehensive metabolic panel - Folate - Hemoglobin A1c - Insulin, random - Lipid panel - T3 - T4 - TSH - VITAMIN D 25 Hydroxy (Vit-D Deficiency, Fractures)    7. At risk for impaired metabolic function Due to Lavon's current state of health and medical condition(s), they are at a significantly higher risk for impaired metabolic function.   This further also puts the patient at much greater risk to also subsequently develop cardiopulmonary conditions that can negatively affect patient's quality of life as well.  At least 23 minutes was spent on counseling Tracie Newman about these concerns today and I stressed the importance of reversing these risks factors.   Initial goal is to lose at least 5-10% of starting weight to help reduce risk factors.   Counseling: Intensive lifestyle modifications discussed with Tracie Newman as most appropriate first line treatment.  she will continue to work on diet, exercise and weight loss efforts.  We will continue to reassess these conditions on a fairly regular basis in an attempt to decrease patient's overall morbidity and mortality    8. Class 3 severe obesity with serious comorbidity and body mass index (BMI) of 40.0 to 44.9 in adult, unspecified obesity type (HCC)  Tracie Newman is currently in the action stage of change and her goal is to continue with weight loss efforts. I recommend Tracie Newman begin the structured treatment plan as follows:  She has agreed to the Category 4 Plan.  Exercise goals: As is.   Behavioral modification strategies: no skipping meals, meal planning and cooking strategies and planning for success.  She was informed of the importance of frequent follow-up visits to maximize her success with intensive lifestyle modifications for her multiple health conditions. She was informed we would discuss  her lab results at her next visit unless there is a critical issue that needs to be addressed sooner. Tracie Newman agreed to keep her next visit at the agreed upon time to discuss these results.  Objective:   Blood pressure 119/78, pulse 82, temperature 98.6 F (37 C), height 5\' 9"  (1.753 m), weight 293 lb (132.9 kg), last menstrual period 07/24/2020, SpO2 98 %. Body mass index is 43.27 kg/m.  EKG: Normal sinus rhythm, rate 88 bpm.  Indirect Calorimeter completed today shows a VO2 of 408 and a REE of 2837.  Her calculated basal metabolic rate is  2267 thus her basal metabolic rate is better than expected.  General: Cooperative, alert, well developed, in no acute distress. HEENT: Conjunctivae and lids unremarkable. Cardiovascular: Regular rhythm.  Lungs: Normal work of breathing. Neurologic: No focal deficits.   Lab Results  Component Value Date   CREATININE 0.66 08/16/2020   BUN 10 08/16/2020   NA 135 08/16/2020   K 4.4 08/16/2020   CL 99 08/16/2020   CO2 23 08/16/2020   Lab Results  Component Value Date   ALT 12 08/16/2020   AST 11 08/16/2020   ALKPHOS 103 08/16/2020   BILITOT 0.3 08/16/2020   Lab Results  Component Value Date   HGBA1C 5.4 08/16/2020   Lab Results  Component Value Date   INSULIN 15.5 08/16/2020   Lab Results  Component Value Date   TSH 2.800 08/16/2020   Lab Results  Component Value Date   CHOL 156 08/16/2020   HDL 50 08/16/2020   LDLCALC 91 08/16/2020   TRIG 75 08/16/2020   CHOLHDL 3.1 08/16/2020   Lab Results  Component Value Date   WBC 7.5 08/16/2020   HGB 13.1 08/16/2020   HCT 40.7 08/16/2020   MCV 88 08/16/2020   PLT 267 08/16/2020   Attestation Statements:   Reviewed by clinician on day of visit: allergies, medications, problem list, medical history, surgical history, family history, social history, and previous encounter notes.  I, Insurance claims handler, CMA, am acting as Energy manager for [employer], DO.  I have reviewed the above  documentation for accuracy and completeness, and I agree with the above. Carlye Grippe, D.O.  The 21st Century Cures Act was signed into law in 2016 which includes the topic of electronic health records.  This provides immediate access to information in MyChart.  This includes consultation notes, operative notes, office notes, lab results and pathology reports.  If you have any questions about what you read please let us know at your next visit so we can discuss your concerns and take corrective action if need be.  We are right here with you.

## 2020-08-22 DIAGNOSIS — M545 Low back pain, unspecified: Secondary | ICD-10-CM | POA: Diagnosis not present

## 2020-08-22 DIAGNOSIS — M542 Cervicalgia: Secondary | ICD-10-CM | POA: Diagnosis not present

## 2020-08-30 ENCOUNTER — Other Ambulatory Visit: Payer: Self-pay

## 2020-08-30 ENCOUNTER — Ambulatory Visit (INDEPENDENT_AMBULATORY_CARE_PROVIDER_SITE_OTHER): Payer: Federal, State, Local not specified - PPO | Admitting: Family Medicine

## 2020-08-30 VITALS — BP 89/67 | HR 86 | Temp 98.6°F | Ht 69.0 in | Wt 291.0 lb

## 2020-08-30 DIAGNOSIS — M545 Low back pain, unspecified: Secondary | ICD-10-CM | POA: Diagnosis not present

## 2020-08-30 DIAGNOSIS — Z6841 Body Mass Index (BMI) 40.0 and over, adult: Secondary | ICD-10-CM

## 2020-08-30 DIAGNOSIS — E559 Vitamin D deficiency, unspecified: Secondary | ICD-10-CM | POA: Diagnosis not present

## 2020-08-30 DIAGNOSIS — M542 Cervicalgia: Secondary | ICD-10-CM | POA: Diagnosis not present

## 2020-08-30 DIAGNOSIS — Z9189 Other specified personal risk factors, not elsewhere classified: Secondary | ICD-10-CM

## 2020-08-30 DIAGNOSIS — E8881 Metabolic syndrome: Secondary | ICD-10-CM | POA: Diagnosis not present

## 2020-08-30 MED ORDER — VITAMIN D (ERGOCALCIFEROL) 1.25 MG (50000 UNIT) PO CAPS: 50000.0000 [IU] | ORAL_CAPSULE | ORAL | 0 refills | Status: DC

## 2020-09-03 NOTE — Progress Notes (Signed)
Chief Complaint:   OBESITY Tracie Newman is here to discuss her progress with her obesity treatment plan along with follow-up of her obesity related diagnoses. Tracie Newman is on the Category 4 Plan and states she is following her eating plan approximately 50% of the time.   Tracie Newman states she is walking for 30 minutes 3 times per week.   Today's visit was #: 2 Starting weight: 293 lbs Starting date: 08/16/2020 Today's weight: 291 lbs Today's date: 08/30/2020 Total lbs lost to date: 2 lbs Total lbs lost since last in-office visit: 2 lbs Total weight loss percentage to date: -0.68%   Interim History: Tracie Newman says she has not had an appetite for weeks.  She followed the plan 50% of the time, and other times she did not eat or ate "less nutritious foods".  "It is so overwhelming - the amount of food I needed to eat."   Assessment/Plan:     1. Vitamin D deficiency New.  Discussed labs with patient today.  Tracie Newman has a history of Vitamin D deficiency with resultant generalized fatigue as her primary symptom.  she is taking no vitamin D supplement for this deficiency and tolerating it well without side-effect.  Most recent Vitamin D lab reviewed-  level: 33.3 on 08/16/2020.  Plan:  - Discussed importance of vitamin D (as well as calcium) to their health and wellbeing.   - We reviewed possible symptoms of low Vitamin D including low energy, depressed mood, muscle aches, joint aches, osteoporosis, etc.  - We discussed that low Vitamin D levels may be linked to an increased risk of cardiovascular events, and even increased risk of cancers, such as colon and breast.   - Educated pt that weight loss will likely improve availability of vitamin D, thus encouraged Tracie Newman to continue with meal plan and their weight loss efforts to further improve this condition.  - I recommend pt take weekly prescription vit D 50,000 IU - see script below- which pt agrees to after discussion of the risks and benefits of  this medication.      - Informed patient this may be a lifelong thing, and she was encouraged to continue to take the medicine until told otherwise.  We will need to monitor levels regularly (every 3-4 mo on average) to keep levels within normal limits.   - All pt's questions and concerns regarding this condition addressed.  Meds ordered this encounter  Medications  . START Vitamin D, Ergocalciferol, (DRISDOL) 1.25 MG (50000 UNIT) CAPS capsule    Sig: Take 1 capsule (50,000 Units total) by mouth every 7 (seven) days.    Dispense:  4 capsule    Refill:  0     2. Insulin resistance New.  Discussed labs with patient today.  Tracie Newman has a diagnosis of insulin resistance based on her elevated fasting insulin level >5. She continues to work on diet and exercise to decrease her risk of diabetes.  She endorses sweets cravings.  Plan:  Disease process counseling done today.  Handouts given after extensive education done. Continue prudent nutritional plan and weight loss.   Lab Results  Component Value Date   INSULIN 15.5 08/16/2020   Lab Results  Component Value Date   HGBA1C 5.4 08/16/2020     3. At risk for diabetes mellitus - Tracie Newman was given extensive diabetes prevention education and counseling today of more than 27 minutes.  - Counseled patient on pathophysiology of disease and discussed various treatment options which always includes dietary  and lifestyle modification as first line.   - Importance of healthy diet with very limited amounts of simple carbohydrates discussed with patient in addition to regular aerobic exercise to an eventual goal of 5d/week or more.  - Handouts provided at patient's desire and or told to go online at the American Diabetes Association website for further information    4. Class 3 severe obesity with serious comorbidity and body mass index (BMI) of 40.0 to 44.9 in adult, unspecified obesity type (HCC)  Tracie Newman is currently in the action stage of  change. As such, her goal is to continue with weight loss efforts. She has agreed to the Category 3 Plan.   Exercise goals: As is.  Behavioral modification strategies: increasing lean protein intake, decreasing simple carbohydrates, increasing water intake, meal planning and cooking strategies, keeping healthy foods in the home and planning for success.   Tracie Newman has agreed to follow-up with our clinic in 2 weeks. She was informed of the importance of frequent follow-up visits to maximize her success with intensive lifestyle modifications for her multiple health conditions.    Objective:   Blood pressure (!) 89/67, pulse 86, temperature 98.6 F (37 C), height 5\' 9"  (1.753 m), weight 291 lb (132 kg), SpO2 97 %. Body mass index is 42.97 kg/m.  General: Cooperative, alert, well developed, in no acute distress. HEENT: Conjunctivae and lids unremarkable. Cardiovascular: Regular rhythm.  Lungs: Normal work of breathing. Neurologic: No focal deficits.   Lab Results  Component Value Date   CREATININE 0.66 08/16/2020   BUN 10 08/16/2020   NA 135 08/16/2020   K 4.4 08/16/2020   CL 99 08/16/2020   CO2 23 08/16/2020   Lab Results  Component Value Date   ALT 12 08/16/2020   AST 11 08/16/2020   ALKPHOS 103 08/16/2020   BILITOT 0.3 08/16/2020   Lab Results  Component Value Date   HGBA1C 5.4 08/16/2020   Lab Results  Component Value Date   INSULIN 15.5 08/16/2020   Lab Results  Component Value Date   TSH 2.800 08/16/2020   Lab Results  Component Value Date   CHOL 156 08/16/2020   HDL 50 08/16/2020   LDLCALC 91 08/16/2020   TRIG 75 08/16/2020   CHOLHDL 3.1 08/16/2020   Lab Results  Component Value Date   WBC 7.5 08/16/2020   HGB 13.1 08/16/2020   HCT 40.7 08/16/2020   MCV 88 08/16/2020   PLT 267 08/16/2020   Attestation Statements:   Reviewed by clinician on day of visit: allergies, medications, problem list, medical history, surgical history, family history, social  history, and previous encounter notes.  I, 08/18/2020, CMA, am acting as Insurance claims handler for Energy manager, DO.  I have reviewed the above documentation for accuracy and completeness, and I agree with the above. [employer], D.O.  The 21st Century Cures Act was signed into law in 2016 which includes the topic of electronic health records.  This provides immediate access to information in MyChart.  This includes consultation notes, operative notes, office notes, lab results and pathology reports.  If you have any questions about what you read please let 2017 know at your next visit so we can discuss your concerns and take corrective action if need be.  We are right here with you.

## 2020-09-13 ENCOUNTER — Ambulatory Visit (INDEPENDENT_AMBULATORY_CARE_PROVIDER_SITE_OTHER): Payer: Federal, State, Local not specified - PPO | Admitting: Family Medicine

## 2020-09-13 ENCOUNTER — Other Ambulatory Visit: Payer: Self-pay

## 2020-09-13 ENCOUNTER — Encounter (INDEPENDENT_AMBULATORY_CARE_PROVIDER_SITE_OTHER): Payer: Self-pay | Admitting: Family Medicine

## 2020-09-13 VITALS — BP 106/68 | HR 98 | Temp 98.5°F | Ht 69.0 in | Wt 292.0 lb

## 2020-09-13 DIAGNOSIS — E559 Vitamin D deficiency, unspecified: Secondary | ICD-10-CM | POA: Diagnosis not present

## 2020-09-13 DIAGNOSIS — Z9189 Other specified personal risk factors, not elsewhere classified: Secondary | ICD-10-CM | POA: Diagnosis not present

## 2020-09-13 DIAGNOSIS — Z6841 Body Mass Index (BMI) 40.0 and over, adult: Secondary | ICD-10-CM

## 2020-09-13 MED ORDER — VITAMIN D (ERGOCALCIFEROL) 1.25 MG (50000 UNIT) PO CAPS: 50000.0000 [IU] | ORAL_CAPSULE | ORAL | 0 refills | Status: DC

## 2020-09-14 DIAGNOSIS — E559 Vitamin D deficiency, unspecified: Secondary | ICD-10-CM | POA: Diagnosis not present

## 2020-09-14 DIAGNOSIS — F32A Depression, unspecified: Secondary | ICD-10-CM | POA: Diagnosis not present

## 2020-09-14 DIAGNOSIS — F419 Anxiety disorder, unspecified: Secondary | ICD-10-CM | POA: Diagnosis not present

## 2020-09-14 DIAGNOSIS — Z23 Encounter for immunization: Secondary | ICD-10-CM | POA: Diagnosis not present

## 2020-09-19 NOTE — Progress Notes (Signed)
Chief Complaint:   OBESITY Tracie Newman is here to discuss her progress with her obesity treatment plan along with follow-up of her obesity related diagnoses. Tracie Newman is on the Category 3 Plan and states she is following her eating plan approximately 50% of the time. Tracie Newman states she is walking and lifting weights for 40 minutes 1-2 times per week.  Today's visit was #: 3 Starting weight: 293 lbs Starting date: 08/16/2020 Today's weight: 292 lbs Today's date: 09/13/2020 Total lbs lost to date: 1 lb Total lbs lost since last in-office visit: +1 lb Total weight loss percentage to date: -0.34%  Interim History:  -Tracie Newman went out of town for the past 2 weekends.  They ate out the entire time, and it was very difficult to make smart choices.  She has not been skipping meals.  No concerns or issues with the plan.  She is worried about Thanksgiving dinner.  Assessment/Plan:   1. Vitamin D deficiency Tracie Newman's Vitamin D level was 33.3 on 08/16/2020. She is currently taking prescription vitamin D 50,000 IU each week. She denies nausea, vomiting or muscle weakness.  Started at last office visit.  Tolerating well without side effects.  She is able to remember to take it.  Denies issues.  Plan:  Refill vitamin D, as per below.  -Refill Vitamin D, Ergocalciferol, (DRISDOL) 1.25 MG (50000 UNIT) CAPS capsule; Take 1 capsule (50,000 Units total) by mouth every 7 (seven) days.  Dispense: 4 capsule; Refill: 0  2. At risk for impaired metabolic function Due to Tracie Newman's current state of health and medical condition(s), they are at a significantly higher risk for impaired metabolic function.  This further also puts the patient at much greater risk to also subsequently develop cardiopulmonary conditions that can negatively affect patient's quality of life as well.  At least 8 minutes was spent on counseling Tracie Newman about these concerns today and I stressed the importance of reversing these risks factors.  Initial goal  is to lose at least 5-10% of starting weight to help reduce risk factors.  Counseling: Intensive lifestyle modifications discussed with Alinda as most appropriate first line treatment.  she will continue to work on diet, exercise and weight loss efforts.  We will continue to reassess these conditions on a fairly regular basis in an attempt to decrease patient's overall morbidity and mortality.      3. Class 3 severe obesity with serious comorbidity and body mass index (BMI) of 40.0 to 44.9 in adult, unspecified obesity type (HCC)  Tracie Newman is currently in the action stage of change. As such, her goal is to continue with weight loss efforts. She has agreed to the Category 3 Plan.   Evidence-based interventions for health behavior change were utilized today including the discussion of self monitoring techniques, problem-solving barriers and SMART goal setting techniques.    Specifically regarding patient's less desirable eating habits and patterns, we employed the technique of small changes when Tracie Newman has not been able to fully commit to her prudent nutritional plan.   Exercise goals: As is.   Behavioral modification strategies: increasing lean protein intake, no skipping meals, holiday eating strategies , celebration eating strategies and planning for success.   Tracie Newman has agreed to follow-up with our clinic in 2-2.5 weeks. She was informed of the importance of frequent follow-up visits to maximize her success with intensive lifestyle modifications for her multiple health conditions.    Objective:   Blood pressure 106/68, pulse 98, temperature 98.5 F (36.9 C), height  5\' 9"  (1.753 m), weight 292 lb (132.5 kg), SpO2 98 %. Body mass index is 43.12 kg/m.  General: Cooperative, alert, well developed, in no acute distress. HEENT: Conjunctivae and lids unremarkable. Cardiovascular: Regular rhythm.  Lungs: Normal work of breathing. Neurologic: No focal deficits.   Lab Results  Component  Value Date   CREATININE 0.66 08/16/2020   BUN 10 08/16/2020   NA 135 08/16/2020   K 4.4 08/16/2020   CL 99 08/16/2020   CO2 23 08/16/2020   Lab Results  Component Value Date   ALT 12 08/16/2020   AST 11 08/16/2020   ALKPHOS 103 08/16/2020   BILITOT 0.3 08/16/2020   Lab Results  Component Value Date   HGBA1C 5.4 08/16/2020   Lab Results  Component Value Date   INSULIN 15.5 08/16/2020   Lab Results  Component Value Date   TSH 2.800 08/16/2020   Lab Results  Component Value Date   CHOL 156 08/16/2020   HDL 50 08/16/2020   LDLCALC 91 08/16/2020   TRIG 75 08/16/2020   CHOLHDL 3.1 08/16/2020   Lab Results  Component Value Date   WBC 7.5 08/16/2020   HGB 13.1 08/16/2020   HCT 40.7 08/16/2020   MCV 88 08/16/2020   PLT 267 08/16/2020   Attestation Statements:   Reviewed by clinician on day of visit: allergies, medications, problem list, medical history, surgical history, family history, social history, and previous encounter notes.  I, 08/18/2020, CMA, am acting as Insurance claims handler for Energy manager, DO.  I have reviewed the above documentation for accuracy and completeness, and I agree with the above. [employer], D.O.  The 21st Century Cures Act was signed into law in 2016 which includes the topic of electronic health records.  This provides immediate access to information in MyChart.  This includes consultation notes, operative notes, office notes, lab results and pathology reports.  If you have any questions about what you read please let 2017 know at your next visit so we can discuss your concerns and take corrective action if need be.  We are right here with you.

## 2020-10-02 ENCOUNTER — Encounter (INDEPENDENT_AMBULATORY_CARE_PROVIDER_SITE_OTHER): Payer: Self-pay | Admitting: Family Medicine

## 2020-10-02 ENCOUNTER — Ambulatory Visit (INDEPENDENT_AMBULATORY_CARE_PROVIDER_SITE_OTHER): Payer: Federal, State, Local not specified - PPO | Admitting: Family Medicine

## 2020-10-02 ENCOUNTER — Other Ambulatory Visit: Payer: Self-pay

## 2020-10-02 VITALS — BP 102/69 | HR 71 | Temp 97.9°F | Ht 69.0 in | Wt 292.0 lb

## 2020-10-02 DIAGNOSIS — Z9189 Other specified personal risk factors, not elsewhere classified: Secondary | ICD-10-CM

## 2020-10-02 DIAGNOSIS — E559 Vitamin D deficiency, unspecified: Secondary | ICD-10-CM

## 2020-10-02 DIAGNOSIS — Z6841 Body Mass Index (BMI) 40.0 and over, adult: Secondary | ICD-10-CM | POA: Diagnosis not present

## 2020-10-02 MED ORDER — VITAMIN D (ERGOCALCIFEROL) 1.25 MG (50000 UNIT) PO CAPS: 50000.0000 [IU] | ORAL_CAPSULE | ORAL | 0 refills | Status: DC

## 2020-10-02 NOTE — Progress Notes (Signed)
Chief Complaint:   OBESITY Tracie Newman is here to discuss her progress with her obesity treatment plan along with follow-up of her obesity related diagnoses. Tracie Newman is on the Category 3 Plan and states she is following her eating plan approximately 50% of the time. Tracie Newman states she is strength training gym exercise 30 minutes 2 times per week.  Today's visit was #: 4 Starting weight: 293 lbs Starting date: 08/16/2020 Today's weight: 292 lbs Today's date: 10/13/2020 Total lbs lost to date: 1 lb Total lbs lost since last in-office visit: 0 lbs Total weight loss percentage to date: -0.34%  Interim History: Tracie Newman states that this time of year is rough for her because she is the only cook and they only want certain unhealthy dishes. She started going to the gym with her brother yesterday, and they plan to go everyday.  Plan: Exercise at goal heart rate of 134-144 range, but not over 150.  Assessment/Plan:   1. Vitamin D deficiency Tracie Newman's Vitamin D level was 33.3 on 08/16/2020. She is currently taking prescription vitamin D 50,000 IU each week. She denies nausea, vomiting or muscle weakness.  Plan: Refill Vit D for 1 month, as per below.  Refill- Vitamin D, Ergocalciferol, (DRISDOL) 1.25 MG (50000 UNIT) CAPS capsule; Take 1 capsule (50,000 Units total) by mouth every 7 (seven) days.  Dispense: 4 capsule; Refill: 0  2. At risk for dehydration Tracie Newman is at higher than average risk of dehydration. Tracie Newman was given more than 9 minutes of proper hydration counseling today.We discussed the signs and symptoms of dehydration some of which may include muscle cramping, constipation or even orthostatic symptoms.   Counseling on the prevention of dehydration was also provided today. Tracie Newman is at risk for dehydration due to weight loss, lifestyle and behavorial habits and possibly due to taking certain medication(s). She was encouraged to adequately hydrate and monitor fluid status to avoid dehydration as  well as weight loss plateaus.  Unless pre-existing renal or cardiopulmonary conditions exist which pt was told to limit their fluid intake, I recommended roughly one half of their weight in pounds to be the approximate ounces of non-caloric, non-caffeinated beverages they should drink per day; including more if they are engaging in exercise.  3. Class 3 severe obesity with serious comorbidity and body mass index (BMI) of 40.0 to 44.9 in adult, unspecified obesity type (HCC) Tracie Newman is currently in the action stage of change. As such, her goal is to continue with weight loss efforts. She has agreed to the Category 3 Plan.   Exercise goals: 30 minutes of weight lifting and 30 minutes of cardio  Behavioral modification strategies: meal planning and cooking strategies, better snacking choices, holiday eating strategies , celebration eating strategies and planning for success.  Tracie Newman has agreed to follow-up with our clinic in 2 weeks. She was informed of the importance of frequent follow-up visits to maximize her success with intensive lifestyle modifications for her multiple health conditions.   Objective:   Blood pressure 102/69, pulse 71, temperature 97.9 F (36.6 C), height 5\' 9"  (1.753 m), weight 292 lb (132.5 kg), SpO2 98 %. Body mass index is 43.12 kg/m.  General: Cooperative, alert, well developed, in no acute distress. HEENT: Conjunctivae and lids unremarkable. Cardiovascular: Regular rhythm.  Lungs: Normal work of breathing. Neurologic: No focal deficits.   Lab Results  Component Value Date   CREATININE 0.66 08/16/2020   BUN 10 08/16/2020   NA 135 08/16/2020   K 4.4 08/16/2020  CL 99 08/16/2020   CO2 23 08/16/2020   Lab Results  Component Value Date   ALT 12 08/16/2020   AST 11 08/16/2020   ALKPHOS 103 08/16/2020   BILITOT 0.3 08/16/2020   Lab Results  Component Value Date   HGBA1C 5.4 08/16/2020   Lab Results  Component Value Date   INSULIN 15.5 08/16/2020   Lab  Results  Component Value Date   TSH 2.800 08/16/2020   Lab Results  Component Value Date   CHOL 156 08/16/2020   HDL 50 08/16/2020   LDLCALC 91 08/16/2020   TRIG 75 08/16/2020   CHOLHDL 3.1 08/16/2020   Lab Results  Component Value Date   WBC 7.5 08/16/2020   HGB 13.1 08/16/2020   HCT 40.7 08/16/2020   MCV 88 08/16/2020   PLT 267 08/16/2020    Attestation Statements:   Reviewed by clinician on day of visit: allergies, medications, problem list, medical history, surgical history, family history, social history, and previous encounter notes.  Tracie Newman, am acting as Energy manager for [employer], DO.  I have reviewed the above documentation for accuracy and completeness, and I agree with the above. Tracie Newman, D.O.  The 21st Century Cures Act was signed into law in 2016 which includes the topic of electronic health records.  This provides immediate access to information in MyChart.  This includes consultation notes, operative notes, office notes, lab results and pathology reports.  If you have any questions about what you read please let us know at your next visit so we can discuss your concerns and take corrective action if need be.  We are right here with you.

## 2020-10-18 IMAGING — DX DG ANKLE COMPLETE 3+V*R*
3 series · 3 of 3 positions shown · non-contrast
Comparison: None.

CLINICAL DATA: Pain after fall

EXAM:
RIGHT ANKLE - COMPLETE 3+ VIEW

[ankle ap]
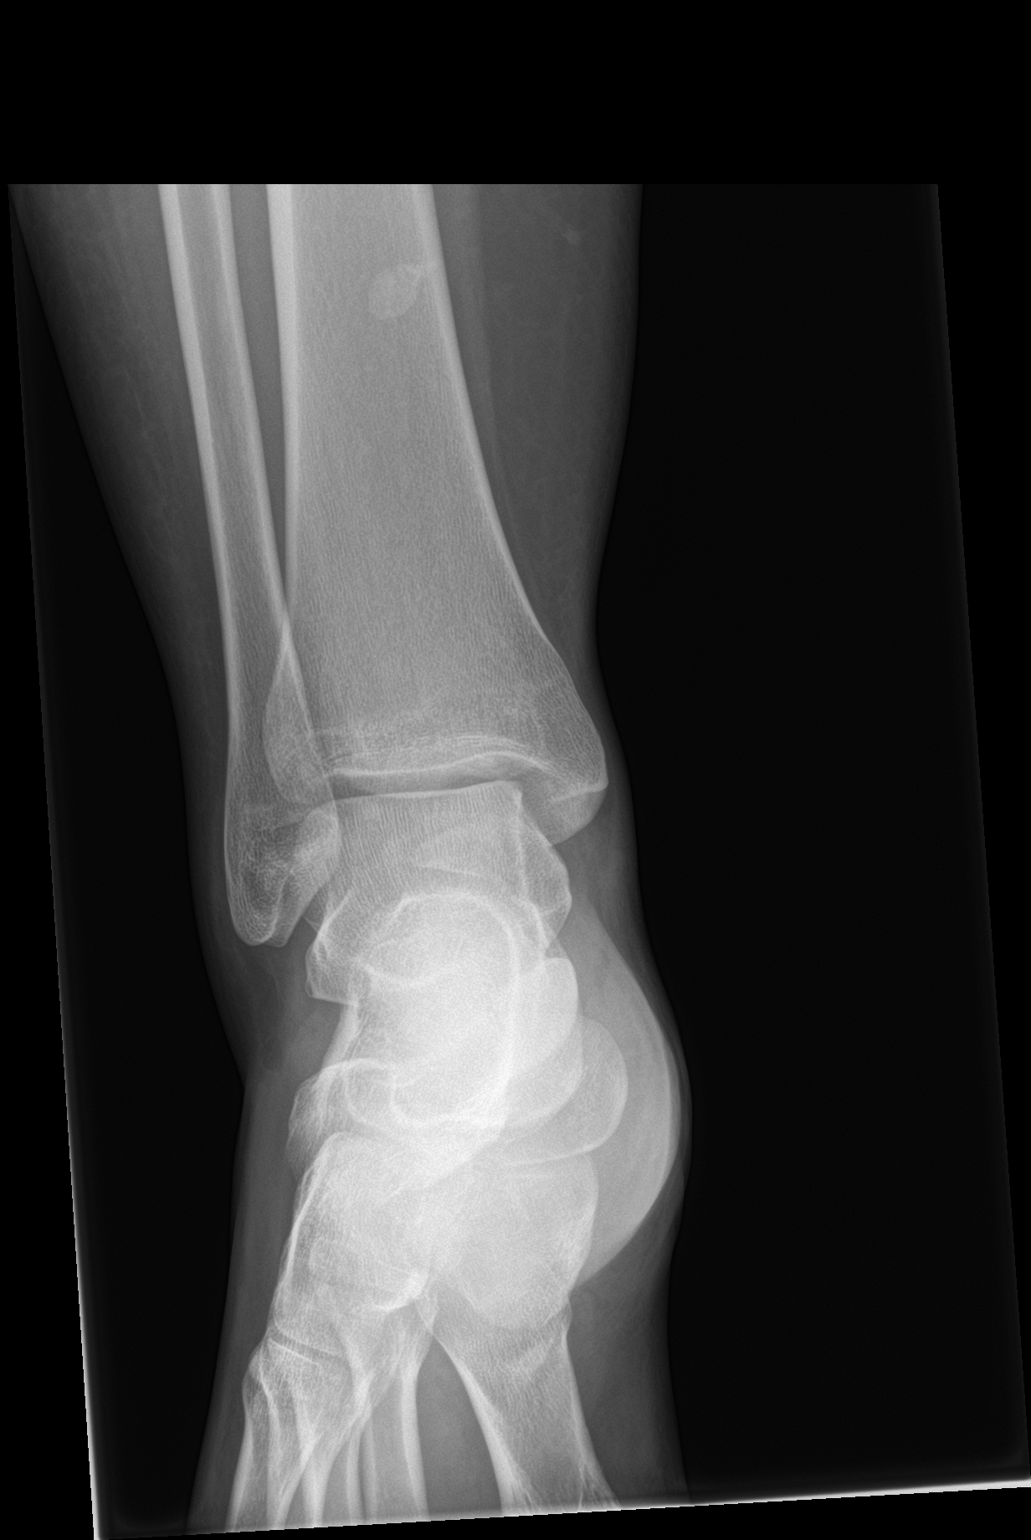

[ankle obl]
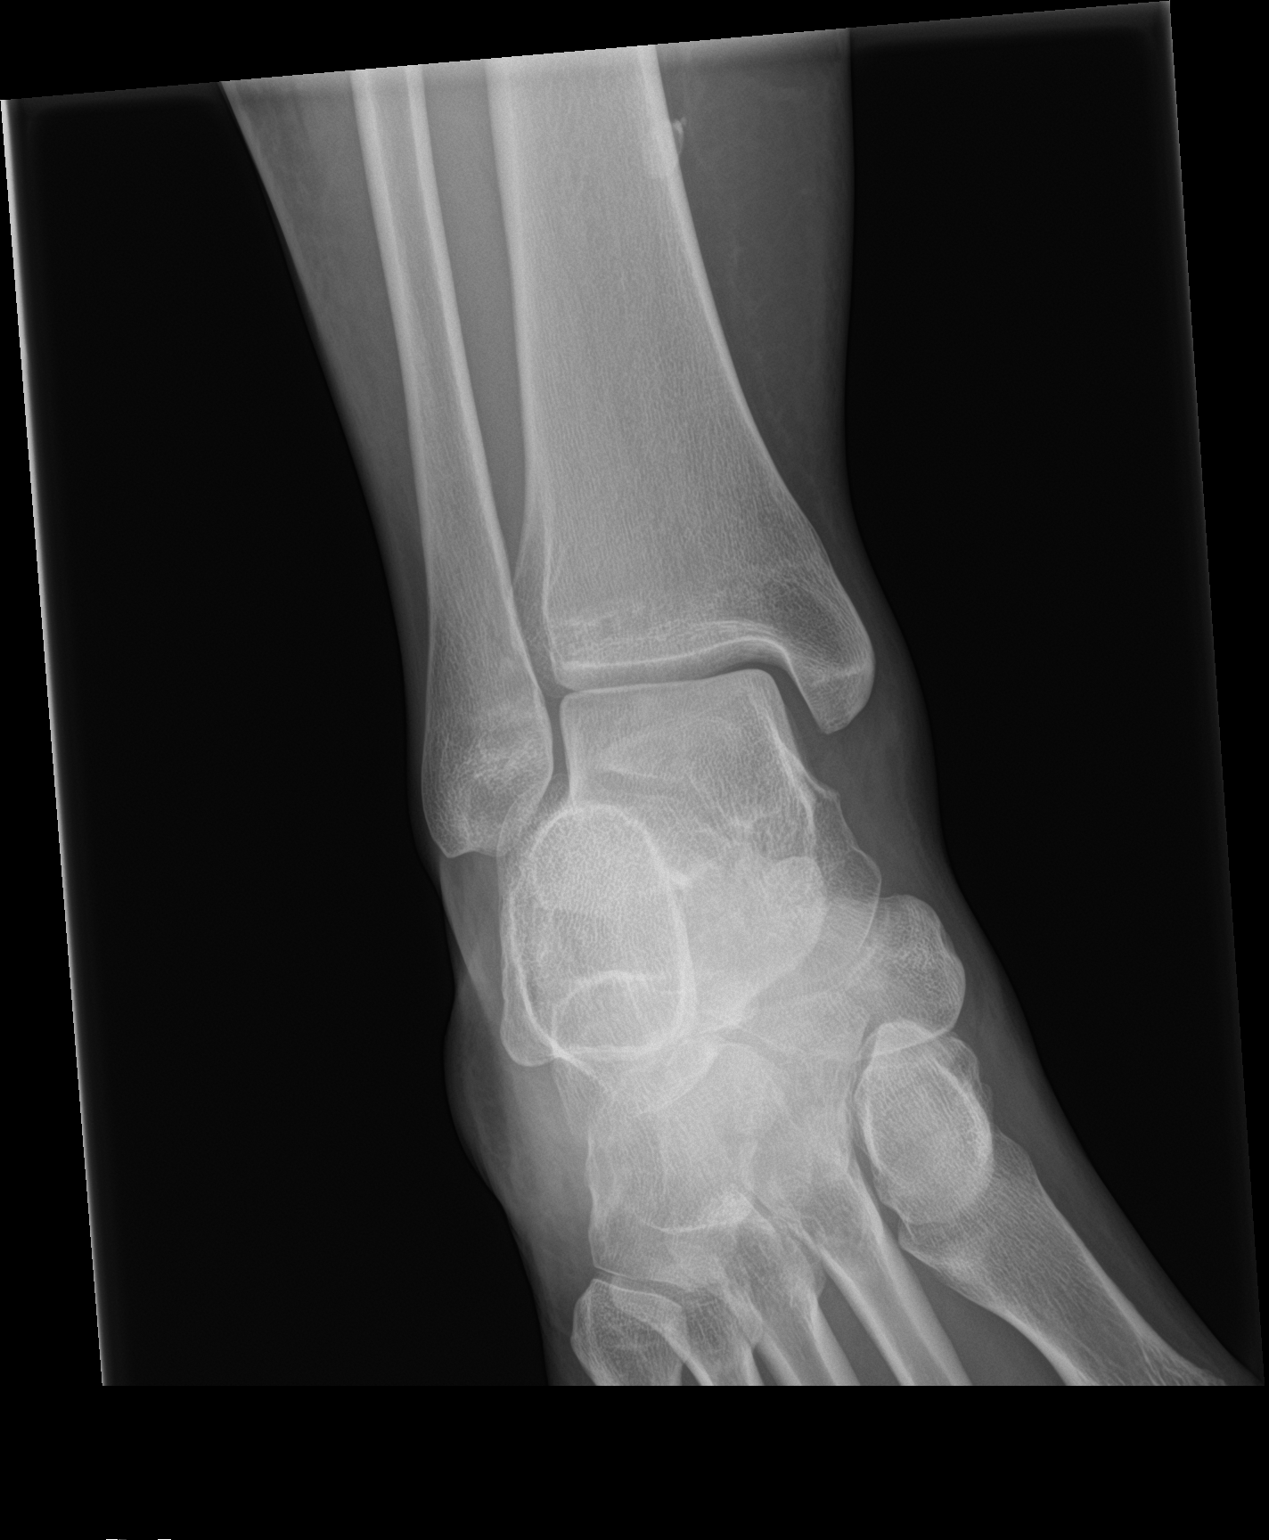

[ankle lat]
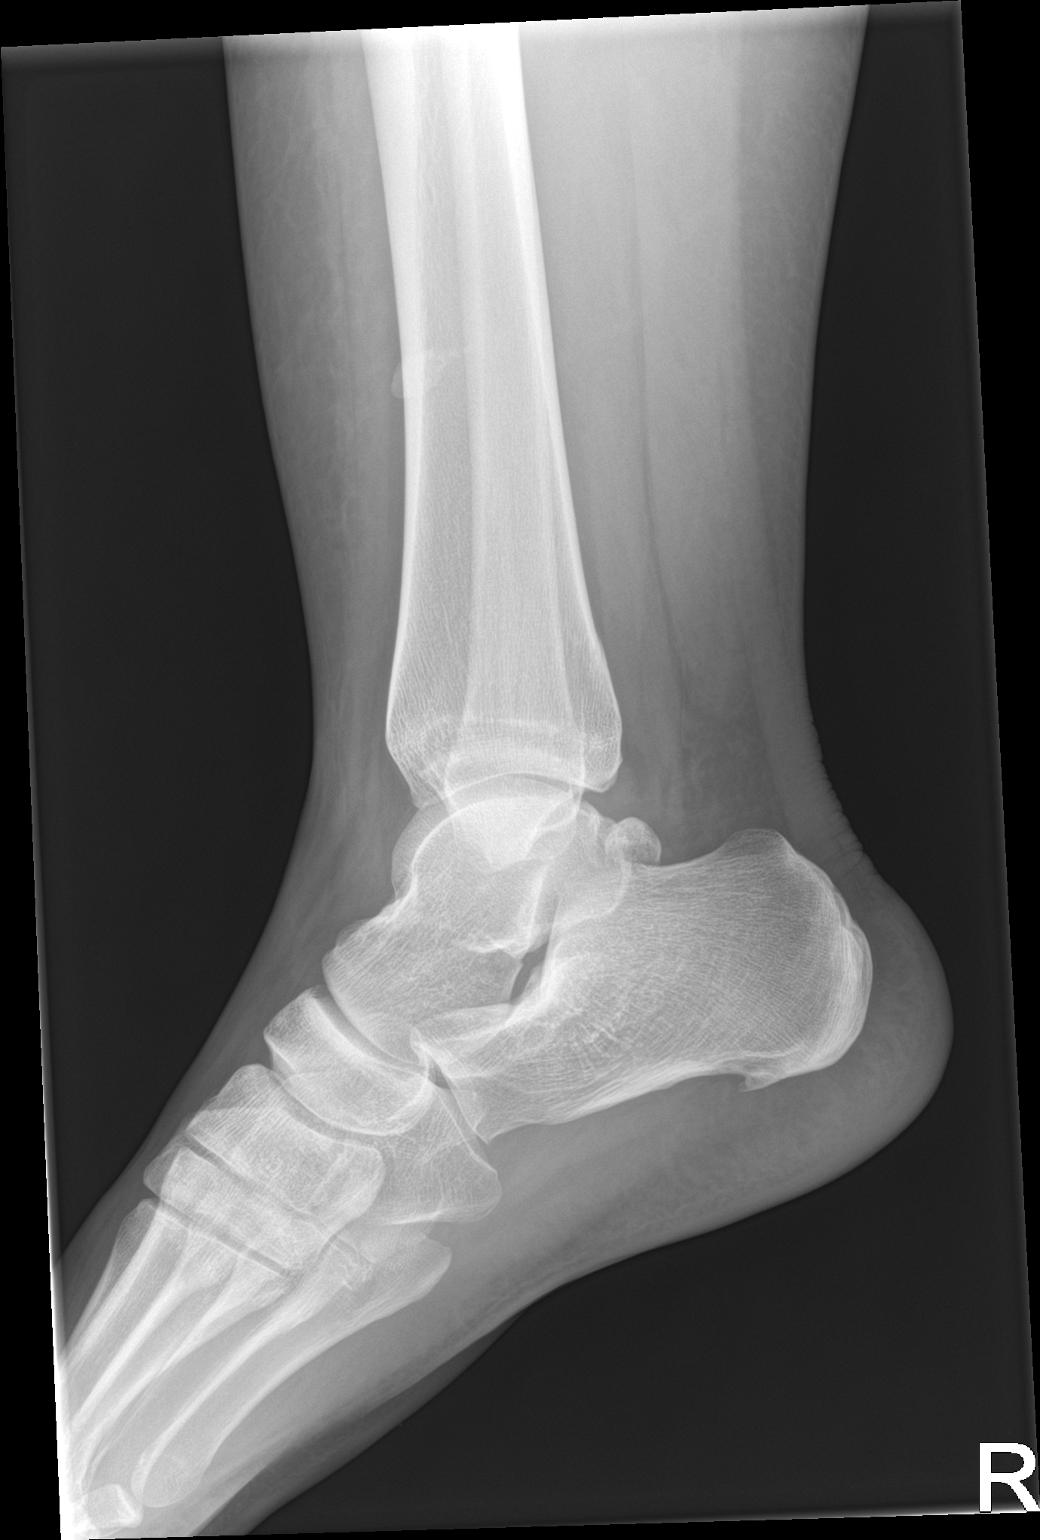

[3 of 3 positions shown; findings below may reference images not displayed]

FINDINGS: A soft tissue calcification projects over the distal tibial
diaphysis of no acute significance. No acute fracture identified.
IMPRESSION: No acute abnormality identified.

## 2020-10-22 ENCOUNTER — Other Ambulatory Visit: Payer: Self-pay

## 2020-10-22 ENCOUNTER — Encounter (INDEPENDENT_AMBULATORY_CARE_PROVIDER_SITE_OTHER): Payer: Self-pay | Admitting: Family Medicine

## 2020-10-22 ENCOUNTER — Ambulatory Visit (INDEPENDENT_AMBULATORY_CARE_PROVIDER_SITE_OTHER): Payer: Federal, State, Local not specified - PPO | Admitting: Family Medicine

## 2020-10-22 VITALS — BP 99/66 | HR 80 | Temp 98.0°F | Ht 69.0 in | Wt 293.0 lb

## 2020-10-22 DIAGNOSIS — Z9189 Other specified personal risk factors, not elsewhere classified: Secondary | ICD-10-CM | POA: Diagnosis not present

## 2020-10-22 DIAGNOSIS — E559 Vitamin D deficiency, unspecified: Secondary | ICD-10-CM | POA: Diagnosis not present

## 2020-10-22 DIAGNOSIS — Z6841 Body Mass Index (BMI) 40.0 and over, adult: Secondary | ICD-10-CM

## 2020-10-22 MED ORDER — VITAMIN D (ERGOCALCIFEROL) 1.25 MG (50000 UNIT) PO CAPS: 50000.0000 [IU] | ORAL_CAPSULE | ORAL | 0 refills | Status: DC

## 2020-10-22 NOTE — Progress Notes (Signed)
Chief Complaint:   OBESITY Tracie Newman is here to discuss her progress with her obesity treatment plan along with follow-up of her obesity related diagnoses. Tracie Newman is on the Category 3 Plan and states she is following her eating plan approximately 70% of the time. Tracie Newman states she is doing gym exercise 20 minutes 3-4 times per week.  Today's visit was #: 5 Starting weight: 293 lbs Starting date: 08/16/2020 Today's weight: 293 lbs Today's date: 10/22/2020 Total lbs lost to date: 0 lb Total lbs lost since last in-office visit: +1 lb Total weight loss percentage to date: 0.00%  Interim History: Tracie Newman has been doing better with the foods. She is following the plan 70% now versus 50% previously. She is going to the gym more regularly and enjoys it. She had a great birthday, but it was a weekend of celebration.  Assessment/Plan:   1. Vitamin D deficiency Tracie Newman's Vitamin D level was 33.3 on 08/16/2020. She is currently taking prescription vitamin D 50,000 IU each week. She denies nausea, vomiting or muscle weakness.   Ref. Range 08/16/2020 10:42  Vitamin D, 25-Hydroxy Latest Ref Range: 30.0 - 100.0 ng/mL 33.3   Plan: Refill Vit D for 1 month, as per below. Low Vitamin D level contributes to fatigue and are associated with obesity, breast, and colon cancer. She agrees to continue to take prescription Vitamin D @50 ,000 IU every week and will follow-up for routine testing of Vitamin D, at least 2-3 times per year to avoid over-replacement.  Refill- Vitamin D, Ergocalciferol, (DRISDOL) 1.25 MG (50000 UNIT) CAPS capsule; Take 1 capsule (50,000 Units total) by mouth every 7 (seven) days.  Dispense: 4 capsule; Refill: 0  2. At risk for impaired metabolic function Tracie Newman was given approximately 15 minutes of impaired  metabolic function prevention counseling today. We discussed intensive lifestyle modifications today with an emphasis on specific nutrition and exercise instructions and strategies.    3. Class 3 severe obesity with serious comorbidity and body mass index (BMI) of 40.0 to 44.9 in adult, unspecified obesity type (HCC) Tracie Newman is currently in the action stage of change. As such, her goal is to continue with weight loss efforts. She has agreed to the Category 3 Plan.   Exercise goals: For substantial health benefits, adults should do at least 150 minutes (2 hours and 30 minutes) a week of moderate-intensity, or 75 minutes (1 hour and 15 minutes) a week of vigorous-intensity aerobic physical activity, or an equivalent combination of moderate- and vigorous-intensity aerobic activity. Aerobic activity should be performed in episodes of at least 10 minutes, and preferably, it should be spread throughout the week.  Behavioral modification strategies: increasing lean protein intake, decreasing simple carbohydrates, decreasing liquid calories, meal planning and cooking strategies, holiday eating strategies  and celebration eating strategies.  Tracie Newman has agreed to follow-up with our clinic in 2-3 weeks. She was informed of the importance of frequent follow-up visits to maximize her success with intensive lifestyle modifications for her multiple health conditions.   Objective:   Blood pressure 99/66, pulse 80, temperature 98 F (36.7 C), height 5\' 9"  (1.753 m), weight 293 lb (132.9 kg), SpO2 98 %. Body mass index is 43.27 kg/m.  General: Cooperative, alert, well developed, in no acute distress. HEENT: Conjunctivae and lids unremarkable. Cardiovascular: Regular rhythm.  Lungs: Normal work of breathing. Neurologic: No focal deficits.   Lab Results  Component Value Date   CREATININE 0.66 08/16/2020   BUN 10 08/16/2020   NA 135 08/16/2020  K 4.4 08/16/2020   CL 99 08/16/2020   CO2 23 08/16/2020   Lab Results  Component Value Date   ALT 12 08/16/2020   AST 11 08/16/2020   ALKPHOS 103 08/16/2020   BILITOT 0.3 08/16/2020   Lab Results  Component Value Date   HGBA1C 5.4  08/16/2020   Lab Results  Component Value Date   INSULIN 15.5 08/16/2020   Lab Results  Component Value Date   TSH 2.800 08/16/2020   Lab Results  Component Value Date   CHOL 156 08/16/2020   HDL 50 08/16/2020   LDLCALC 91 08/16/2020   TRIG 75 08/16/2020   CHOLHDL 3.1 08/16/2020   Lab Results  Component Value Date   WBC 7.5 08/16/2020   HGB 13.1 08/16/2020   HCT 40.7 08/16/2020   MCV 88 08/16/2020   PLT 267 08/16/2020    Attestation Statements:   Reviewed by clinician on day of visit: allergies, medications, problem list, medical history, surgical history, family history, social history, and previous encounter notes.  Edmund Hilda, am acting as Energy manager for [employer], DO.  I have reviewed the above documentation for accuracy and completeness, and I agree with the above. Carlye Grippe, D.O.  The 21st Century Cures Act was signed into law in 2016 which includes the topic of electronic health records.  This provides immediate access to information in MyChart.  This includes consultation notes, operative notes, office notes, lab results and pathology reports.  If you have any questions about what you read please let us know at your next visit so we can discuss your concerns and take corrective action if need be.  We are right here with you.

## 2020-11-14 ENCOUNTER — Encounter (INDEPENDENT_AMBULATORY_CARE_PROVIDER_SITE_OTHER): Payer: Self-pay | Admitting: Family Medicine

## 2020-11-14 ENCOUNTER — Ambulatory Visit (INDEPENDENT_AMBULATORY_CARE_PROVIDER_SITE_OTHER): Payer: Federal, State, Local not specified - PPO | Admitting: Family Medicine

## 2020-11-14 ENCOUNTER — Other Ambulatory Visit: Payer: Self-pay

## 2020-11-14 VITALS — BP 109/75 | HR 91 | Temp 98.4°F | Ht 69.0 in | Wt 292.0 lb

## 2020-11-14 DIAGNOSIS — Z6841 Body Mass Index (BMI) 40.0 and over, adult: Secondary | ICD-10-CM

## 2020-11-14 DIAGNOSIS — Z9189 Other specified personal risk factors, not elsewhere classified: Secondary | ICD-10-CM | POA: Diagnosis not present

## 2020-11-14 DIAGNOSIS — E559 Vitamin D deficiency, unspecified: Secondary | ICD-10-CM | POA: Diagnosis not present

## 2020-11-14 MED ORDER — VITAMIN D (ERGOCALCIFEROL) 1.25 MG (50000 UNIT) PO CAPS: 50000.0000 [IU] | ORAL_CAPSULE | ORAL | 0 refills | Status: DC

## 2020-11-18 NOTE — Progress Notes (Unsigned)
Chief Complaint:   OBESITY Tracie Newman is here to discuss her progress with her obesity treatment plan along with follow-up of her obesity related diagnoses. Tracie Newman is on the Category 3 Plan and states she is following her eating plan approximately 70% of the time. Tracie Newman states she is going to the gym 45 minutes 4 times per week.  Today's visit was #: 6 Starting weight: 293 lbs Starting date: 08/16/2020 Today's weight: 292 lbs Today's date: 11/14/2020 Total lbs lost to date: 1 lb Total lbs lost since last in-office visit: 1 lb Total weight loss percentage to date: -0.34%  Interim History: Tracie Newman and friends are getting together and holding each other accountable by sharing data on exercise with each other via apple watches, etc. Tracie Newman's goal for exercise is 45 minutes 5 days a week. She started January 1st. She eats out 4-5 days a week on average and struggles with sticking to the meal plan still.  Plan: Discussed with patient meal prep ideas and gave handouts of recipes. Continue sharing fitness goals with tribe and hold each other accountable.  Assessment/Plan:   1. Vitamin D deficiency Tracie Newman Vitamin D level was 33.3 on 08/16/2020. She is currently taking prescription vitamin D 50,000 IU each week. She denies nausea, vomiting or muscle weakness.  Ref. Range 08/16/2020 10:42  Vitamin D, 25-Hydroxy Latest Ref Range: 30.0 - 100.0 ng/mL 33.3   Plan: Refill Vit D for 1 month, as per below. Low Vitamin D level contributes to fatigue and are associated with obesity, breast, and colon cancer. She agrees to continue to take prescription Vitamin D @50 ,000 IU every week and will follow-up for routine testing of Vitamin D, at least 2-3 times per year to avoid over-replacement.  Refill- Vitamin D, Ergocalciferol, (DRISDOL) 1.25 MG (50000 UNIT) CAPS capsule; Take 1 capsule (50,000 Units total) by mouth every 7 (seven) days.  Dispense: 4 capsule; Refill: 0  2. At risk for impaired metabolic  function Tracie Newman was given approximately 15 minutes of impaired  metabolic function prevention counseling today. We discussed intensive lifestyle modifications today with an emphasis on specific nutrition and exercise instructions and strategies.   3. Class 3 severe obesity with serious comorbidity and body mass index (BMI) of 40.0 to 44.9 in adult, unspecified obesity type (HCC) Tracie Newman is currently in the action stage of change. As such, her goal is to continue with weight loss efforts. She has agreed to the Category 3 Plan.   Exercise goals: As is  Behavioral modification strategies: no skipping meals, meal planning and cooking strategies and planning for success.  Tracie Newman has agreed to follow-up with our clinic in 2 weeks. She was informed of the importance of frequent follow-up visits to maximize her success with intensive lifestyle modifications for her multiple health conditions.   Objective:   Blood pressure 109/75, pulse 91, temperature 98.4 F (36.9 C), height 5\' 9"  (1.753 m), weight 292 lb (132.5 kg), SpO2 97 %. Body mass index is 43.12 kg/m.  General: Cooperative, alert, well developed, in no acute distress. HEENT: Conjunctivae and lids unremarkable. Cardiovascular: Regular rhythm.  Lungs: Normal work of breathing. Neurologic: No focal deficits.   Lab Results  Component Value Date   CREATININE 0.66 08/16/2020   BUN 10 08/16/2020   NA 135 08/16/2020   K 4.4 08/16/2020   CL 99 08/16/2020   CO2 23 08/16/2020   Lab Results  Component Value Date   ALT 12 08/16/2020   AST 11 08/16/2020   ALKPHOS 103  08/16/2020   BILITOT 0.3 08/16/2020   Lab Results  Component Value Date   HGBA1C 5.4 08/16/2020   Lab Results  Component Value Date   INSULIN 15.5 08/16/2020   Lab Results  Component Value Date   TSH 2.800 08/16/2020   Lab Results  Component Value Date   CHOL 156 08/16/2020   HDL 50 08/16/2020   LDLCALC 91 08/16/2020   TRIG 75 08/16/2020   CHOLHDL 3.1 08/16/2020    Lab Results  Component Value Date   WBC 7.5 08/16/2020   HGB 13.1 08/16/2020   HCT 40.7 08/16/2020   MCV 88 08/16/2020   PLT 267 08/16/2020    Attestation Statements:   Reviewed by clinician on day of visit: allergies, medications, problem list, medical history, surgical history, family history, social history, and previous encounter notes.  Edmund Hilda, am acting as Energy manager for [employer], DO.  I have reviewed the above documentation for accuracy and completeness, and I agree with the above. Carlye Grippe, D.O.  The 21st Century Cures Act was signed into law in 2016 which includes the topic of electronic health records.  This provides immediate access to information in MyChart.  This includes consultation notes, operative notes, office notes, lab results and pathology reports.  If you have any questions about what you read please let us know at your next visit so we can discuss your concerns and take corrective action if need be.  We are right here with you.

## 2020-11-28 ENCOUNTER — Ambulatory Visit (INDEPENDENT_AMBULATORY_CARE_PROVIDER_SITE_OTHER): Payer: Federal, State, Local not specified - PPO | Admitting: Family Medicine

## 2020-11-28 ENCOUNTER — Encounter (INDEPENDENT_AMBULATORY_CARE_PROVIDER_SITE_OTHER): Payer: Self-pay | Admitting: Family Medicine

## 2020-11-28 ENCOUNTER — Other Ambulatory Visit: Payer: Self-pay

## 2020-11-28 VITALS — BP 100/70 | HR 82 | Temp 98.1°F | Ht 69.0 in | Wt 293.0 lb

## 2020-11-28 DIAGNOSIS — Z9189 Other specified personal risk factors, not elsewhere classified: Secondary | ICD-10-CM | POA: Diagnosis not present

## 2020-11-28 DIAGNOSIS — Z6841 Body Mass Index (BMI) 40.0 and over, adult: Secondary | ICD-10-CM

## 2020-11-28 DIAGNOSIS — F411 Generalized anxiety disorder: Secondary | ICD-10-CM

## 2020-11-28 DIAGNOSIS — E559 Vitamin D deficiency, unspecified: Secondary | ICD-10-CM

## 2020-11-29 DIAGNOSIS — M542 Cervicalgia: Secondary | ICD-10-CM | POA: Diagnosis not present

## 2020-11-29 NOTE — Progress Notes (Addendum)
Chief Complaint:   OBESITY Tracie Newman is here to discuss her progress with her obesity treatment plan along with follow-up of her obesity related diagnoses. Tracie Newman is on the Category 3 Plan and states she is following her eating plan approximately 70% of the time. Tracie Newman states she is going to the gym 45 minutes 3 times per week.  Today's visit was #: 7 Starting weight: 293 lbs Starting date: 08/16/2020 Today's weight: 293 lbs Today's date: 11/28/2020 Total lbs lost to date: 0 Total lbs lost since last in-office visit: 0  Interim History: Tracie Newman reports her eating is significantly better than it's ever been, but recognizes that she needs to exercise.   Assessment/Plan:    1. GAD (generalized anxiety disorder) Tracie Newman reports anxiety is stable. She is on Zoloft. She has been doing some emotional eating, but she does not want additional meds at all.   Plan: Discussed with Tracie Newman emotional eating strategies. Increase exercise to help with stress management and stay on plan. Meal prep and planning is key.  2. Vitamin D deficiency Tracie Newman's Vitamin D level was 33.3 on 08/16/2020. She is currently taking prescription vitamin D 50,000 IU each week. She denies nausea, vomiting or muscle weakness.   Ref. Range 08/16/2020 10:42  Vitamin D, 25-Hydroxy Latest Ref Range: 30.0 - 100.0 ng/mL 33.3   Plan: Continue current treatment plan. No need for refill today. Low Vitamin D level contributes to fatigue and are associated with obesity, breast, and colon cancer. She agrees to continue to take prescription Vitamin D @50 ,000 IU every week and will follow-up for routine testing of Vitamin D, at least 2-3 times per year to avoid over-replacement.  3. At risk for malnutrition Tracie Newman was given approximately 10 minutes of counseling today regarding prevention of malnutrition and ways to meet macronutrient goals..   4. Class 3 severe obesity with serious comorbidity and body mass index (BMI) of 40.0 to 44.9 in adult,  unspecified obesity type (HCC) Tracie Newman is currently in the action stage of change. As such, her goal is to continue with weight loss efforts. She has agreed to the Category 3 Plan.   Exercise goals: For substantial health benefits, adults should do at least 150 minutes (2 hours and 30 minutes) a week of moderate-intensity, or 75 minutes (1 hour and 15 minutes) a week of vigorous-intensity aerobic physical activity, or an equivalent combination of moderate- and vigorous-intensity aerobic activity. Aerobic activity should be performed in episodes of at least 10 minutes, and preferably, it should be spread throughout the week. Try for 45 minutes 5 days a week  Behavioral modification strategies: increasing lean protein intake, increasing water intake, no skipping meals, meal planning and cooking strategies and planning for success.  Tracie Newman has agreed to follow-up with our clinic in 2 weeks. She was informed of the importance of frequent follow-up visits to maximize her success with intensive lifestyle modifications for her multiple health conditions.   Objective:   Blood pressure 100/70, pulse 82, temperature 98.1 F (36.7 C), height 5\' 9"  (1.753 m), weight 293 lb (132.9 kg), SpO2 97 %. Body mass index is 43.27 kg/m.  General: Cooperative, alert, well developed, in no acute distress. HEENT: Conjunctivae and lids unremarkable. Cardiovascular: Regular rhythm.  Lungs: Normal work of breathing. Neurologic: No focal deficits.   Lab Results  Component Value Date   CREATININE 0.66 08/16/2020   BUN 10 08/16/2020   NA 135 08/16/2020   K 4.4 08/16/2020   CL 99 08/16/2020   CO2  23 08/16/2020   Lab Results  Component Value Date   ALT 12 08/16/2020   AST 11 08/16/2020   ALKPHOS 103 08/16/2020   BILITOT 0.3 08/16/2020   Lab Results  Component Value Date   HGBA1C 5.4 08/16/2020   Lab Results  Component Value Date   INSULIN 15.5 08/16/2020   Lab Results  Component Value Date   TSH 2.800  08/16/2020   Lab Results  Component Value Date   CHOL 156 08/16/2020   HDL 50 08/16/2020   LDLCALC 91 08/16/2020   TRIG 75 08/16/2020   CHOLHDL 3.1 08/16/2020   Lab Results  Component Value Date   WBC 7.5 08/16/2020   HGB 13.1 08/16/2020   HCT 40.7 08/16/2020   MCV 88 08/16/2020   PLT 267 08/16/2020   No results found for: IRON, TIBC, FERRITIN  Attestation Statements:   Reviewed by clinician on day of visit: allergies, medications, problem list, medical history, surgical history, family history, social history, and previous encounter notes.  Edmund Hilda, am acting as Energy manager for [employer], DO.  I have reviewed the above documentation for accuracy and completeness, and I agree with the above. Carlye Grippe, D.O.  The 21st Century Cures Act was signed into law in 2016 which includes the topic of electronic health records.  This provides immediate access to information in MyChart.  This includes consultation notes, operative notes, office notes, lab results and pathology reports.  If you have any questions about what you read please let us know at your next visit so we can discuss your concerns and take corrective action if need be.  We are right here with you.

## 2020-12-03 DIAGNOSIS — M9901 Segmental and somatic dysfunction of cervical region: Secondary | ICD-10-CM | POA: Diagnosis not present

## 2020-12-03 DIAGNOSIS — M9907 Segmental and somatic dysfunction of upper extremity: Secondary | ICD-10-CM | POA: Diagnosis not present

## 2020-12-03 DIAGNOSIS — M7541 Impingement syndrome of right shoulder: Secondary | ICD-10-CM | POA: Diagnosis not present

## 2020-12-03 DIAGNOSIS — M9902 Segmental and somatic dysfunction of thoracic region: Secondary | ICD-10-CM | POA: Diagnosis not present

## 2020-12-04 DIAGNOSIS — M542 Cervicalgia: Secondary | ICD-10-CM | POA: Diagnosis not present

## 2020-12-05 DIAGNOSIS — M9901 Segmental and somatic dysfunction of cervical region: Secondary | ICD-10-CM | POA: Diagnosis not present

## 2020-12-05 DIAGNOSIS — M9907 Segmental and somatic dysfunction of upper extremity: Secondary | ICD-10-CM | POA: Diagnosis not present

## 2020-12-05 DIAGNOSIS — M9902 Segmental and somatic dysfunction of thoracic region: Secondary | ICD-10-CM | POA: Diagnosis not present

## 2020-12-05 DIAGNOSIS — M7541 Impingement syndrome of right shoulder: Secondary | ICD-10-CM | POA: Diagnosis not present

## 2020-12-10 DIAGNOSIS — M542 Cervicalgia: Secondary | ICD-10-CM | POA: Diagnosis not present

## 2020-12-11 ENCOUNTER — Ambulatory Visit (INDEPENDENT_AMBULATORY_CARE_PROVIDER_SITE_OTHER): Payer: Federal, State, Local not specified - PPO | Admitting: Family Medicine

## 2020-12-11 ENCOUNTER — Other Ambulatory Visit: Payer: Self-pay

## 2020-12-11 ENCOUNTER — Encounter (INDEPENDENT_AMBULATORY_CARE_PROVIDER_SITE_OTHER): Payer: Self-pay | Admitting: Family Medicine

## 2020-12-11 VITALS — BP 105/72 | HR 90 | Temp 98.7°F | Ht 69.0 in | Wt 291.0 lb

## 2020-12-11 DIAGNOSIS — E66813 Obesity, class 3: Secondary | ICD-10-CM

## 2020-12-11 DIAGNOSIS — Z9189 Other specified personal risk factors, not elsewhere classified: Secondary | ICD-10-CM

## 2020-12-11 DIAGNOSIS — E8881 Metabolic syndrome: Secondary | ICD-10-CM | POA: Diagnosis not present

## 2020-12-11 DIAGNOSIS — E88819 Insulin resistance, unspecified: Secondary | ICD-10-CM

## 2020-12-11 DIAGNOSIS — E559 Vitamin D deficiency, unspecified: Secondary | ICD-10-CM | POA: Diagnosis not present

## 2020-12-11 DIAGNOSIS — Z6841 Body Mass Index (BMI) 40.0 and over, adult: Secondary | ICD-10-CM | POA: Diagnosis not present

## 2020-12-11 MED ORDER — VITAMIN D (ERGOCALCIFEROL) 1.25 MG (50000 UNIT) PO CAPS: 50000.0000 [IU] | ORAL_CAPSULE | ORAL | 0 refills | Status: DC

## 2020-12-12 DIAGNOSIS — M9901 Segmental and somatic dysfunction of cervical region: Secondary | ICD-10-CM | POA: Diagnosis not present

## 2020-12-12 DIAGNOSIS — M9902 Segmental and somatic dysfunction of thoracic region: Secondary | ICD-10-CM | POA: Diagnosis not present

## 2020-12-12 DIAGNOSIS — M7541 Impingement syndrome of right shoulder: Secondary | ICD-10-CM | POA: Diagnosis not present

## 2020-12-12 DIAGNOSIS — M9907 Segmental and somatic dysfunction of upper extremity: Secondary | ICD-10-CM | POA: Diagnosis not present

## 2020-12-13 NOTE — Progress Notes (Signed)
Chief Complaint:   OBESITY Tracie Newman is here to discuss her progress with her obesity treatment plan along with follow-up of her obesity related diagnoses.   Today's visit was #: 8 Starting weight: 293 lbs Starting date: 08/16/2020 Today's weight: 291 lbs Today's date: 12/11/2020 Total lbs lost to date: 2 lbs Body mass index is 42.97 kg/m.  Total weight loss percentage to date: -0.68%  Interim History: [name] says that he is doing a challenge with his friends regarding exercising daily and getting healthy with them.  He says he has improved with the meal plan and is eating less "off plan" foods.  Nutrition Plan: Category 3 Plan for 90% of the time. Activity: Going to the gym and walking for 45-60 minutes 7 times per week. This patient is following the prescribed meal plan meal without concerns.  Food recall appears to be consistent with the prescribed plan.  When following the plan, hunger and cravings are well controlled.    Assessment/Plan:   Orders Placed This Encounter  Procedures  . VITAMIN D 25 Hydroxy (Vit-D Deficiency, Fractures)    Medications Discontinued During This Encounter  Medication Reason  . Vitamin D, Ergocalciferol, (DRISDOL) 1.25 MG (50000 UNIT) CAPS capsule Reorder     Meds ordered this encounter  Medications  . Vitamin D, Ergocalciferol, (DRISDOL) 1.25 MG (50000 UNIT) CAPS capsule    Sig: Take 1 capsule (50,000 Units total) by mouth every 7 (seven) days.    Dispense:  4 capsule    Refill:  0     1. Insulin resistance At goal. Goal is HgbA1c < 5.7, fasting insulin closer to 5.  Medication: None.    Plan:  She will continue to focus on protein-rich, low simple carbohydrate foods. We reviewed the importance of hydration, regular exercise for stress reduction, and restorative sleep.   Lab Results  Component Value Date   HGBA1C 5.4 08/16/2020   Lab Results  Component Value Date   INSULIN 15.5 08/16/2020   2. Vitamin D deficiency Not at goal.  Current vitamin D is 33.3, tested on 08/16/2020. Optimal goal > 50 ng/dL.   Plan: Continue to take prescription Vitamin D @50 ,000 IU every week as prescribed.  Follow-up for routine testing of Vitamin D, at least 2-3 times per year to avoid over-replacement.  Will check vitamin D level at next visit.  - Refill Vitamin D, Ergocalciferol, (DRISDOL) 1.25 MG (50000 UNIT) CAPS capsule; Take 1 capsule (50,000 Units total) by mouth every 7 (seven) days.  Dispense: 4 capsule; Refill: 0 - VITAMIN D 25 Hydroxy (Vit-D Deficiency, Fractures)  3. At risk for impaired metabolic function Due to Tracie Newman's current state of health and medical condition(s), she is at a significantly higher risk for impaired metabolic function.  This places the patient at a much greater risk to subsequently develop cardiopulmonary conditions that can negatively affect the patient's quality of life.  At least 9 minutes was spent on counseling Tracie Newman about these concerns today, and I stressed the importance of reversing these risks factors.  The initial goal is to lose at least 5-10% of starting weight to help reduce risk factors.  Counseling:  Intensive lifestyle modifications discussed with Tracie Newman as the most appropriate first line treatment.  she will continue to work on diet, exercise, and weight loss efforts.  We will continue to reassess these conditions on a fairly regular basis in an attempt to decrease the patient's overall morbidity and mortality.  4. Class 3 severe obesity with serious  comorbidity and body mass index (BMI) of 40.0 to 44.9 in adult, unspecified obesity type (HCC)  Course: Tracie Newman is currently in the action stage of change. As such, her goal is to continue with weight loss efforts.   Nutrition goals: She has agreed to the Category 3 Plan with breakfast options.   Exercise goals: As is.  Behavioral modification strategies: increasing lean protein intake, decreasing simple carbohydrates, meal planning and cooking  strategies, avoiding temptations and planning for success.  Tracie Newman has agreed to follow-up with our clinic in 2 weeks.  Will check vitamin D level at next office visit.  She was informed of the importance of frequent follow-up visits to maximize her success with intensive lifestyle modifications for her multiple health conditions.   Tracie Newman was informed we would discuss her lab results at her next visit unless there is a critical issue that needs to be addressed sooner. Tracie Newman agreed to keep her next visit at the agreed upon time to discuss these results.  Objective:   Blood pressure 105/72, pulse 90, temperature 98.7 F (37.1 C), height 5\' 9"  (1.753 m), weight 291 lb (132 kg), SpO2 98 %. Body mass index is 42.97 kg/m.  General: Cooperative, alert, well developed, in no acute distress. HEENT: Conjunctivae and lids unremarkable. Cardiovascular: Regular rhythm.  Lungs: Normal work of breathing. Neurologic: No focal deficits.   Lab Results  Component Value Date   CREATININE 0.66 08/16/2020   BUN 10 08/16/2020   NA 135 08/16/2020   K 4.4 08/16/2020   CL 99 08/16/2020   CO2 23 08/16/2020   Lab Results  Component Value Date   ALT 12 08/16/2020   AST 11 08/16/2020   ALKPHOS 103 08/16/2020   BILITOT 0.3 08/16/2020   Lab Results  Component Value Date   HGBA1C 5.4 08/16/2020   Lab Results  Component Value Date   INSULIN 15.5 08/16/2020   Lab Results  Component Value Date   TSH 2.800 08/16/2020   Lab Results  Component Value Date   CHOL 156 08/16/2020   HDL 50 08/16/2020   LDLCALC 91 08/16/2020   TRIG 75 08/16/2020   CHOLHDL 3.1 08/16/2020   Lab Results  Component Value Date   WBC 7.5 08/16/2020   HGB 13.1 08/16/2020   HCT 40.7 08/16/2020   MCV 88 08/16/2020   PLT 267 08/16/2020   Attestation Statements:   Reviewed by clinician on day of visit: allergies, medications, problem list, medical history, surgical history, family history, social history, and previous  encounter notes.  I, 08/18/2020, CMA, am acting as Insurance claims handler for Energy manager, DO.  I have reviewed the above documentation for accuracy and completeness, and I agree with the above. [employer], D.O.  The 21st Century Cures Act was signed into law in 2016 which includes the topic of electronic health records.  This provides immediate access to information in MyChart.  This includes consultation notes, operative notes, office notes, lab results and pathology reports.  If you have any questions about what you read please let 2017 know at your next visit so we can discuss your concerns and take corrective action if need be.  We are right here with you.

## 2020-12-17 DIAGNOSIS — M9907 Segmental and somatic dysfunction of upper extremity: Secondary | ICD-10-CM | POA: Diagnosis not present

## 2020-12-17 DIAGNOSIS — M7541 Impingement syndrome of right shoulder: Secondary | ICD-10-CM | POA: Diagnosis not present

## 2020-12-17 DIAGNOSIS — M9901 Segmental and somatic dysfunction of cervical region: Secondary | ICD-10-CM | POA: Diagnosis not present

## 2020-12-17 DIAGNOSIS — M9902 Segmental and somatic dysfunction of thoracic region: Secondary | ICD-10-CM | POA: Diagnosis not present

## 2020-12-18 DIAGNOSIS — M542 Cervicalgia: Secondary | ICD-10-CM | POA: Diagnosis not present

## 2020-12-24 DIAGNOSIS — M9907 Segmental and somatic dysfunction of upper extremity: Secondary | ICD-10-CM | POA: Diagnosis not present

## 2020-12-24 DIAGNOSIS — M9902 Segmental and somatic dysfunction of thoracic region: Secondary | ICD-10-CM | POA: Diagnosis not present

## 2020-12-24 DIAGNOSIS — M9901 Segmental and somatic dysfunction of cervical region: Secondary | ICD-10-CM | POA: Diagnosis not present

## 2020-12-24 DIAGNOSIS — M7541 Impingement syndrome of right shoulder: Secondary | ICD-10-CM | POA: Diagnosis not present

## 2020-12-28 DIAGNOSIS — M542 Cervicalgia: Secondary | ICD-10-CM | POA: Diagnosis not present

## 2021-01-02 ENCOUNTER — Ambulatory Visit (INDEPENDENT_AMBULATORY_CARE_PROVIDER_SITE_OTHER): Payer: Federal, State, Local not specified - PPO | Admitting: Family Medicine

## 2021-01-02 ENCOUNTER — Encounter (INDEPENDENT_AMBULATORY_CARE_PROVIDER_SITE_OTHER): Payer: Self-pay

## 2021-01-03 DIAGNOSIS — M542 Cervicalgia: Secondary | ICD-10-CM | POA: Diagnosis not present

## 2021-01-07 ENCOUNTER — Ambulatory Visit (INDEPENDENT_AMBULATORY_CARE_PROVIDER_SITE_OTHER): Payer: Federal, State, Local not specified - PPO | Admitting: Family Medicine

## 2021-01-10 DIAGNOSIS — M542 Cervicalgia: Secondary | ICD-10-CM | POA: Diagnosis not present

## 2021-01-14 DIAGNOSIS — M9907 Segmental and somatic dysfunction of upper extremity: Secondary | ICD-10-CM | POA: Diagnosis not present

## 2021-01-14 DIAGNOSIS — M7541 Impingement syndrome of right shoulder: Secondary | ICD-10-CM | POA: Diagnosis not present

## 2021-01-14 DIAGNOSIS — M9901 Segmental and somatic dysfunction of cervical region: Secondary | ICD-10-CM | POA: Diagnosis not present

## 2021-01-14 DIAGNOSIS — M9902 Segmental and somatic dysfunction of thoracic region: Secondary | ICD-10-CM | POA: Diagnosis not present

## 2021-01-28 DIAGNOSIS — F419 Anxiety disorder, unspecified: Secondary | ICD-10-CM | POA: Diagnosis not present

## 2021-01-28 DIAGNOSIS — E669 Obesity, unspecified: Secondary | ICD-10-CM | POA: Diagnosis not present

## 2021-02-07 ENCOUNTER — Ambulatory Visit
Admission: EM | Admit: 2021-02-07 | Discharge: 2021-02-07 | Disposition: A | Discharge status: Home / Self Care | Payer: Federal, State, Local not specified - PPO | Attending: Emergency Medicine | Admitting: Emergency Medicine

## 2021-02-07 ENCOUNTER — Other Ambulatory Visit: Payer: Self-pay

## 2021-02-07 ENCOUNTER — Encounter: Payer: Self-pay | Admitting: Emergency Medicine

## 2021-02-07 DIAGNOSIS — J039 Acute tonsillitis, unspecified: Secondary | ICD-10-CM | POA: Insufficient documentation

## 2021-02-07 DIAGNOSIS — J029 Acute pharyngitis, unspecified: Secondary | ICD-10-CM | POA: Insufficient documentation

## 2021-02-07 LAB — POCT RAPID STREP A (OFFICE): Rapid Strep A Screen: NEGATIVE

## 2021-02-07 MED ORDER — AMOXICILLIN 500 MG PO CAPS: 500.0000 mg | ORAL_CAPSULE | Freq: Two times a day (BID) | ORAL | 0 refills | Status: AC

## 2021-02-07 NOTE — ED Triage Notes (Signed)
Sore throat since last night with ears hurting

## 2021-02-07 NOTE — ED Provider Notes (Signed)
[facility]   [id number] 02/07/21 Arrival Time: 1640  [id number] THROAT  SUBJECTIVE: History from: patient.  Tracie Newman is a 30 y.o. female who presents with ear pain, and sore throat x 1 day.  Denies sick exposure to strep, flu or mono, or precipitating event.  Has tried OTC medications without relief.  Symptoms are made worse with swallowing, but tolerating liquids and own secretions without difficulty.  Reports previous symptoms in the past.  Denies fever, chills, fatigue, cough, SOB, wheezing, chest pain, nausea, rash, changes in bowel or bladder habits.     ROS: As per HPI.  All other pertinent ROS negative.     Past Medical History:  Diagnosis Date  . Anxiety   . B12 deficiency   . Back pain   . GERD (gastroesophageal reflux disease)   . History of stomach ulcers   . Vitamin D deficiency    History reviewed. No pertinent surgical history. No Known Allergies No current facility-administered medications on file prior to encounter.   Current Outpatient Medications on File Prior to Encounter  Medication Sig Dispense Refill  . sertraline (ZOLOFT) 100 MG tablet Take 100 mg by mouth daily.    . Vitamin D, Ergocalciferol, (DRISDOL) 1.25 MG (50000 UNIT) CAPS capsule Take 1 capsule (50,000 Units total) by mouth every 7 (seven) days. 4 capsule 0   Social History   Socioeconomic History  . Marital status: Single    Spouse name: Not on file  . Number of children: Not on file  . Years of education: Not on file  . Highest education level: Not on file  Occupational History  . Occupation: Pensions consultant    Comment: Department of the [employer]  . Smoking status: Never Smoker  . Smokeless tobacco: Never Used  Substance and Sexual Activity  . Alcohol use: Never  . Drug use: Never  . Sexual activity: Not on file  Other Topics Concern  . Not on file  Social History Narrative  . Not on file   Social Determinants of Health   Financial Resource Strain: Not on  file  Food Insecurity: Not on file  Transportation Needs: Not on file  Physical Activity: Not on file  Stress: Not on file  Social Connections: Not on file  Intimate Partner Violence: Not on file   Family History  Problem Relation Age of Onset  . Obesity Mother   . Heart disease Father   . Sleep apnea Father   . Obesity Father     OBJECTIVE:  Vitals:   02/07/21 1652  BP: 133/83  Pulse: 98  Resp: 18  Temp: 98.8 F (37.1 C)  TempSrc: Oral  SpO2: 96%     General appearance: alert; appears fatigued, but nontoxic, speaking in full sentences and managing own secretions HEENT: NCAT; Ears: EACs clear, TMs pearly gray with visible cone of light, without erythema; Eyes: PERRL, EOMI grossly; Nose: no obvious rhinorrhea; Throat: oropharynx clear, tonsils 1+ and erythematous with white tonsillar exudates, uvula midline Neck: supple without LAD Lungs: CTA bilaterally without adventitious breath sounds; cough absent Heart: regular rate and rhythm.  Radial pulses 2+ symmetrical bilaterally Skin: warm and dry Psychological: alert and cooperative; normal mood and affect  LABS: Results for orders placed or performed during the hospital encounter of 02/07/21 (from the past 24 hour(s))  POCT rapid strep A     Status: None   Collection Time: 02/07/21  4:56 PM  Result Value Ref Range   Rapid Strep A  Screen Negative Negative     ASSESSMENT & PLAN:  1. Sore throat   2. Acute tonsillitis, unspecified etiology     Meds ordered this encounter  Medications  . amoxicillin (AMOXIL) 500 MG capsule    Sig: Take 1 capsule (500 mg total) by mouth 2 (two) times daily for 10 days.    Dispense:  20 capsule    Refill:  0    Order Specific Question:   Supervising Provider    Answer:   Eustace Moore [[id number]]    Strep test negative, will send out for culture and we will call you with results Get plenty of rest and push fluids However, based exam with cover for possible strep Drink warm or  cool liquids, use throat lozenges, or popsicles to help alleviate symptoms Take OTC ibuprofen or tylenol as needed for pain Follow up with PCP if symptoms persists Return or go to ER if patient has any new or worsening symptoms such as fever, chills, nausea, vomiting, worsening sore throat, cough, abdominal pain, chest pain, changes in bowel or bladder habits, etc...  Reviewed expectations re: course of current medical issues. Questions answered. Outlined signs and symptoms indicating need for more acute intervention. Patient verbalized understanding. After Visit Summary given.        Rennis Harding, PA-C 02/07/21 1749

## 2021-02-07 NOTE — Discharge Instructions (Signed)
Strep test negative, will send out for culture and we will call you with results Get plenty of rest and push fluids However, based exam with cover for possible strep Drink warm or cool liquids, use throat lozenges, or popsicles to help alleviate symptoms Take OTC ibuprofen or tylenol as needed for pain Follow up with PCP if symptoms persists Return or go to ER if patient has any new or worsening symptoms such as fever, chills, nausea, vomiting, worsening sore throat, cough, abdominal pain, chest pain, changes in bowel or bladder habits, etc..Marland Kitchen

## 2021-02-10 LAB — CULTURE, GROUP A STREP (THRC)

## 2021-02-18 DIAGNOSIS — H6123 Impacted cerumen, bilateral: Secondary | ICD-10-CM | POA: Diagnosis not present

## 2021-02-18 DIAGNOSIS — J039 Acute tonsillitis, unspecified: Secondary | ICD-10-CM | POA: Diagnosis not present

## 2021-02-21 DIAGNOSIS — M791 Myalgia, unspecified site: Secondary | ICD-10-CM | POA: Diagnosis not present

## 2021-02-28 DIAGNOSIS — Z13220 Encounter for screening for lipoid disorders: Secondary | ICD-10-CM | POA: Diagnosis not present

## 2021-02-28 DIAGNOSIS — F419 Anxiety disorder, unspecified: Secondary | ICD-10-CM | POA: Diagnosis not present

## 2021-02-28 DIAGNOSIS — Z01419 Encounter for gynecological examination (general) (routine) without abnormal findings: Secondary | ICD-10-CM | POA: Diagnosis not present

## 2021-02-28 DIAGNOSIS — Z13228 Encounter for screening for other metabolic disorders: Secondary | ICD-10-CM | POA: Diagnosis not present

## 2021-02-28 DIAGNOSIS — Z0000 Encounter for general adult medical examination without abnormal findings: Secondary | ICD-10-CM | POA: Diagnosis not present

## 2021-02-28 DIAGNOSIS — E669 Obesity, unspecified: Secondary | ICD-10-CM | POA: Diagnosis not present

## 2021-02-28 DIAGNOSIS — Z113 Encounter for screening for infections with a predominantly sexual mode of transmission: Secondary | ICD-10-CM | POA: Diagnosis not present

## 2021-03-18 DIAGNOSIS — Z0100 Encounter for examination of eyes and vision without abnormal findings: Secondary | ICD-10-CM | POA: Diagnosis not present

## 2021-03-18 DIAGNOSIS — H10413 Chronic giant papillary conjunctivitis, bilateral: Secondary | ICD-10-CM | POA: Diagnosis not present

## 2021-04-02 DIAGNOSIS — R768 Other specified abnormal immunological findings in serum: Secondary | ICD-10-CM | POA: Diagnosis not present

## 2021-10-25 ENCOUNTER — Other Ambulatory Visit: Payer: Self-pay

## 2021-10-25 ENCOUNTER — Ambulatory Visit
Admission: EM | Admit: 2021-10-25 | Discharge: 2021-10-25 | Disposition: A | Discharge status: Home / Self Care | Payer: Federal, State, Local not specified - PPO | Attending: Physician Assistant | Admitting: Physician Assistant

## 2021-10-25 DIAGNOSIS — Z20822 Contact with and (suspected) exposure to covid-19: Secondary | ICD-10-CM

## 2021-10-25 DIAGNOSIS — J0120 Acute ethmoidal sinusitis, unspecified: Secondary | ICD-10-CM

## 2021-10-25 MED ORDER — AMOXICILLIN 500 MG PO CAPS: 500.0000 mg | ORAL_CAPSULE | Freq: Three times a day (TID) | ORAL | 0 refills | Status: DC

## 2021-10-25 NOTE — Discharge Instructions (Signed)
RETURN IF ANY PROBLEMS.  °

## 2021-10-25 NOTE — ED Triage Notes (Signed)
Patient states she has a lot of pressure and congestion in her face.   Patient states that the pressure is in her nose and she cant breath through her nose.   She states she has tried OTC meds without any relief.  Patient states her right ear is very full and itchy feeling.   Denies Fever

## 2021-10-25 NOTE — ED Provider Notes (Signed)
[facility]    CSN: [id number] Arrival date & time: 10/25/21  1253      History   Chief Complaint Chief Complaint  Patient presents with   Sinusitis    HPI Tracie Newman is a 30 y.o. female.   The history is provided by the patient. No language interpreter was used.  Sinusitis Pain details:    Location:  Frontal   Quality:  Aching   Severity:  Moderate   Timing:  Constant Progression:  Worsening Chronicity:  New Relieved by:  Nothing Worsened by:  Nothing Ineffective treatments:  None tried Associated symptoms: no vomiting    Past Medical History:  Diagnosis Date   Anxiety    B12 deficiency    Back pain    GERD (gastroesophageal reflux disease)    History of stomach ulcers    Vitamin D deficiency     Patient Active Problem List   Diagnosis Date Noted   GAD (generalized anxiety disorder) 11/28/2020   At risk for malnutrition 11/28/2020   At risk for dehydration 10/02/2020   At risk for impaired metabolic function 09/13/2020   Insulin resistance 08/30/2020   At risk for diabetes mellitus 08/30/2020   Class 3 severe obesity with serious comorbidity and body mass index (BMI) of 40.0 to 44.9 in adult (HCC) 08/30/2020   Mood disorder (HCC) - Emotional eating 08/16/2020   Gastroesophageal reflux disease 08/16/2020   B12 deficiency 08/16/2020   Vitamin D deficiency 08/16/2020   H/O gastric ulcer 08/16/2020   BMI 40.0-44.9, adult (HCC) 08/16/2020    History reviewed. No pertinent surgical history.  OB History     Gravida  0   Para  0   Term  0   Preterm  0   AB  0   Living  0      SAB  0   IAB  0   Ectopic  0   Multiple  0   Live Births  0            Home Medications    Prior to Admission medications   Medication Sig Start Date End Date Taking? Authorizing Provider  amoxicillin (AMOXIL) 500 MG capsule Take 1 capsule (500 mg total) by mouth 3 (three) times daily. 10/25/21  Yes Cheron Schaumann K, PA-C  sertraline  (ZOLOFT) 100 MG tablet Take 100 mg by mouth daily.    [provider]  Vitamin D, Ergocalciferol, (DRISDOL) 1.25 MG (50000 UNIT) CAPS capsule Take 1 capsule (50,000 Units total) by mouth every 7 (seven) days. 12/11/20   Thomasene Lot, DO    Family History Family History  Problem Relation Age of Onset   Obesity Mother    Heart disease Father    Sleep apnea Father    Obesity Father     Social History Social History   Tobacco Use   Smoking status: Never   Smokeless tobacco: Never  Vaping Use   Vaping Use: Never used  Substance Use Topics   Alcohol use: Yes    Comment: occassionally   Drug use: Never     Allergies   Patient has no known allergies.   Review of Systems Review of Systems  Gastrointestinal:  Negative for vomiting.  All other systems reviewed and are negative.   Physical Exam Triage Vital Signs ED Triage Vitals  Enc Vitals Group     BP 10/25/21 1404 125/86     Pulse Rate 10/25/21 1404 82     Resp 10/25/21 1404  16     Temp 10/25/21 1404 97.9 F (36.6 C)     Temp Source 10/25/21 1404 Oral     SpO2 10/25/21 1404 95 %     Weight --      Height --      Head Circumference --      Peak Flow --      Pain Score 10/25/21 1400 6     Pain Loc --      Pain Edu? --      Excl. in GC? --    No data found.  Updated Vital Signs BP 125/86 (BP Location: Right Arm)    Pulse 82    Temp 97.9 F (36.6 C) (Oral)    Resp 16    LMP 10/21/2021 (Exact Date)    SpO2 95%   Visual Acuity Right Eye Distance:   Left Eye Distance:   Bilateral Distance:    Right Eye Near:   Left Eye Near:    Bilateral Near:     Physical Exam Vitals reviewed.  HENT:     Mouth/Throat:     Mouth: Mucous membranes are moist.  Eyes:     Pupils: Pupils are equal, round, and reactive to light.  Cardiovascular:     Rate and Rhythm: Normal rate.     Pulses: Normal pulses.  Pulmonary:     Effort: Pulmonary effort is normal.  Abdominal:     General: Abdomen is flat.   Musculoskeletal:     Cervical back: Normal range of motion.  Skin:    General: Skin is warm.  Neurological:     General: No focal deficit present.     Mental Status: She is alert.  Psychiatric:        Mood and Affect: Mood normal.     UC Treatments / Results  Labs (all labs ordered are listed, but only abnormal results are displayed) Labs Reviewed  COVID-19, FLU A+B NAA    EKG   Radiology No results found.  Procedures Procedures (including critical care time)  Medications Ordered in UC Medications - No data to display  Initial Impression / Assessment and Plan / UC Course  I have reviewed the triage vital signs and the nursing notes.  Pertinent labs & imaging results that were available during my care of the patient were reviewed by me and considered in my medical decision making (see chart for details).     Final Clinical Impressions(s) / UC Diagnoses   Final diagnoses:  Exposure to COVID-19 virus  Acute ethmoidal sinusitis, recurrence not specified     Discharge Instructions      RETURN IF ANY PROBLEMS.    ED Prescriptions     Medication Sig Dispense Auth. Provider   amoxicillin (AMOXIL) 500 MG capsule Take 1 capsule (500 mg total) by mouth 3 (three) times daily. 30 capsule Elson Areas, [place]      PDMP not reviewed this encounter.   Elson Areas, [place] 10/25/21 1607

## 2021-10-26 LAB — COVID-19, FLU A+B NAA
Influenza A, NAA: NOT DETECTED
Influenza B, NAA: NOT DETECTED
SARS-CoV-2, NAA: NOT DETECTED

## 2022-06-11 ENCOUNTER — Encounter (INDEPENDENT_AMBULATORY_CARE_PROVIDER_SITE_OTHER): Payer: Self-pay

## 2023-07-15 DIAGNOSIS — F418 Other specified anxiety disorders: Secondary | ICD-10-CM | POA: Diagnosis not present

## 2023-07-15 DIAGNOSIS — M542 Cervicalgia: Secondary | ICD-10-CM | POA: Diagnosis not present

## 2023-08-17 DIAGNOSIS — M545 Low back pain, unspecified: Secondary | ICD-10-CM | POA: Diagnosis not present

## 2023-08-17 DIAGNOSIS — M2560 Stiffness of unspecified joint, not elsewhere classified: Secondary | ICD-10-CM | POA: Diagnosis not present

## 2023-08-17 DIAGNOSIS — R7989 Other specified abnormal findings of blood chemistry: Secondary | ICD-10-CM | POA: Diagnosis not present

## 2023-08-17 DIAGNOSIS — E538 Deficiency of other specified B group vitamins: Secondary | ICD-10-CM | POA: Diagnosis not present

## 2023-08-17 DIAGNOSIS — Z111 Encounter for screening for respiratory tuberculosis: Secondary | ICD-10-CM | POA: Diagnosis not present

## 2023-08-17 DIAGNOSIS — M542 Cervicalgia: Secondary | ICD-10-CM | POA: Diagnosis not present

## 2023-08-17 DIAGNOSIS — R5383 Other fatigue: Secondary | ICD-10-CM | POA: Diagnosis not present

## 2023-08-17 DIAGNOSIS — M533 Sacrococcygeal disorders, not elsewhere classified: Secondary | ICD-10-CM | POA: Diagnosis not present

## 2023-09-01 DIAGNOSIS — M545 Low back pain, unspecified: Secondary | ICD-10-CM | POA: Diagnosis not present

## 2023-09-01 DIAGNOSIS — M542 Cervicalgia: Secondary | ICD-10-CM | POA: Diagnosis not present

## 2023-09-01 DIAGNOSIS — M2560 Stiffness of unspecified joint, not elsewhere classified: Secondary | ICD-10-CM | POA: Diagnosis not present

## 2023-09-01 DIAGNOSIS — E538 Deficiency of other specified B group vitamins: Secondary | ICD-10-CM | POA: Diagnosis not present

## 2023-09-21 DIAGNOSIS — Z0000 Encounter for general adult medical examination without abnormal findings: Secondary | ICD-10-CM | POA: Diagnosis not present

## 2023-09-21 DIAGNOSIS — E538 Deficiency of other specified B group vitamins: Secondary | ICD-10-CM | POA: Diagnosis not present

## 2023-09-21 DIAGNOSIS — E559 Vitamin D deficiency, unspecified: Secondary | ICD-10-CM | POA: Diagnosis not present

## 2023-09-21 DIAGNOSIS — E669 Obesity, unspecified: Secondary | ICD-10-CM | POA: Diagnosis not present

## 2023-09-22 DIAGNOSIS — E669 Obesity, unspecified: Secondary | ICD-10-CM | POA: Diagnosis not present

## 2023-09-22 DIAGNOSIS — Z0000 Encounter for general adult medical examination without abnormal findings: Secondary | ICD-10-CM | POA: Diagnosis not present

## 2023-10-21 DIAGNOSIS — E669 Obesity, unspecified: Secondary | ICD-10-CM | POA: Diagnosis not present

## 2023-10-22 ENCOUNTER — Ambulatory Visit: Payer: Federal, State, Local not specified - PPO | Admitting: Neurology

## 2023-10-22 VITALS — BP 117/74 | HR 113 | Ht 69.0 in | Wt 286.0 lb

## 2023-10-22 DIAGNOSIS — R202 Paresthesia of skin: Secondary | ICD-10-CM

## 2023-10-22 MED ORDER — DULOXETINE HCL 30 MG PO CPEP: 30.0000 mg | ORAL_CAPSULE | Freq: Every day | ORAL | 0 refills | Status: DC

## 2023-10-22 MED ORDER — ALPRAZOLAM 0.5 MG PO TABS: ORAL_TABLET | ORAL | 0 refills | Status: DC

## 2023-10-22 MED ORDER — DULOXETINE HCL 60 MG PO CPEP: 60.0000 mg | ORAL_CAPSULE | Freq: Every day | ORAL | 11 refills | Status: DC

## 2023-10-22 NOTE — Progress Notes (Signed)
Chief Complaint  Patient presents with   New Patient (Initial Visit)    Rm15, alone, paper referral for cervical radiculopathy and intermittent burning sensation in scalp/Dr. Marney Setting at [facility]: left sided pain starting neck and radiates down left side of body, tension headache, dizziness, off balance (orthostatic vitals taken, numbness/tingly  down left side of body       ASSESSMENT AND PLAN  Tracie Newman is a 32 y.o. female   Intermittent generalized fatigue, left-sided paresthesia, diffuse body achy pain Depression anxiety  MRI of the brain to rule out right hemisphere pathology  Laboratory evaluations including inflammatory markers  Cymbalta, 30 mg daily titrating to 60 mg daily  DIAGNOSTIC DATA (LABS, IMAGING, TESTING) - I reviewed patient records, labs, notes, testing and imaging myself where available.   MEDICAL HISTORY:  Tracie Newman is a 32 year old female, seen in request by her primary care from [facility] Dr. Donette Larry, Jerelyn Scott for evaluation of intermittent fatigue, left side paresthesia, initial evaluation was on October 22, 2023,    History is obtained from the patient and review of electronic medical records. I personally reviewed pertinent available imaging films in PACS.   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 [facility] to [facility], was treated for depression anxiety with Prozac, from March to September 2024, did help her symptoms, since she moved back to [place] around October 2024, she has stopped taking the medication, she denies relapse of her mood disorder, does complains of difficulty falling and staying asleep,  She complains of intermittent neck pain radiating pain to left upper extremity, low back pain, radiating to left lower extremity, left upper and lower extremity involvement can happen at the same time, but denied persistent sensory loss, or gait abnormality  Over the past  couple years, she also experiences bouts of exacerbation of her body achy pain, fatigue, left-sided paresthesia, she Google search her symptoms, worry about possibility of multiple sclerosis   She now complains of intermittent muscle achy pain, multiple joints pain, was under the care of rheumatologist, laboratory evaluation in February 2024 did demonstrate elevated ESR 29, C-reactive protein 19, would like continued rheumatology evaluation   PHYSICAL EXAM:   Vitals:   10/22/23 0812 10/22/23 0814 10/22/23 0815  BP: 136/86 132/72 117/74  Pulse: 92 86 (!) 113  Weight:   286 lb (129.7 kg)  Height:   5\' 9"  (1.753 m)   Not recorded     Body mass index is 42.23 kg/m.  PHYSICAL EXAMNIATION:  Gen: NAD, conversant, well nourised, well groomed                     Cardiovascular: Regular rate rhythm, no peripheral edema, warm, nontender. Eyes: Conjunctivae clear without exudates or hemorrhage Neck: Supple, no carotid bruits. Pulmonary: Clear to auscultation bilaterally   NEUROLOGICAL EXAM:  MENTAL STATUS: Speech/cognition: Awake, alert, oriented to history taking and casual conversation CRANIAL NERVES: CN II: Visual fields are full to confrontation. Pupils are round equal and briskly reactive to light.  Funduscopic examination was normal CN III, IV, VI: extraocular movement are normal. No ptosis. CN V: Facial sensation is intact to light touch CN VII: Face is symmetric with normal eye closure  CN VIII: Hearing is normal to causal conversation. CN IX, X: Phonation is normal. CN XI: Head turning and shoulder shrug are intact  MOTOR: There is no pronator drift of out-stretched arms. Muscle bulk and  tone are normal. Muscle strength is normal.  REFLEXES: Reflexes are 2+ and symmetric at the biceps, triceps, knees, and ankles. Plantar responses are flexor.  SENSORY: Intact to light touch, pinprick and vibratory sensation are intact in fingers and toes.  COORDINATION: There is no  trunk or limb dysmetria noted.  GAIT/STANCE: Posture is normal. Gait is steady with normal steps, base, arm swing, and turning. Heel and toe walking are normal. Tandem gait is normal.  Romberg is absent.  REVIEW OF SYSTEMS:  Full 14 system review of systems performed and notable only for as above All other review of systems were negative.   ALLERGIES: Allergies  Allergen Reactions   Bupropion Nausea Only    HOME MEDICATIONS: No current outpatient medications on file.   No current facility-administered medications for this visit.    PAST MEDICAL HISTORY: Past Medical History:  Diagnosis Date   Anxiety    B12 deficiency    Back pain    GERD (gastroesophageal reflux disease)    History of stomach ulcers    Vitamin D deficiency     PAST SURGICAL HISTORY: No past surgical history on file.  FAMILY HISTORY: Family History  Problem Relation Age of Onset   Obesity Mother    Heart disease Father    Sleep apnea Father    Obesity Father     SOCIAL HISTORY: Social History   Socioeconomic History   Marital status: Single    Spouse name: Not on file   Number of children: Not on file   Years of education: Not on file   Highest education level: Not on file  Occupational History   Occupation: Attorney    Comment: [employer]  Tobacco Use   Smoking status: Never   Smokeless tobacco: Never  Vaping Use   Vaping status: Never Used  Substance and Sexual Activity   Alcohol use: Yes    Comment: occassionally   Drug use: Never   Sexual activity: Yes    Comment: sometimes uses condoms and birth control  Other Topics Concern   Not on file  Social History Narrative   Not on file   Social Drivers of Health   Financial Resource Strain: Not on file  Food Insecurity: Not on file  Transportation Needs: Not on file  Physical Activity: Not on file  Stress: Not on file  Social Connections: Not on file  Intimate Partner Violence: Low Risk  (04/08/2023)    Received from [facility]   Interpersonal Safety    Feels UN-safe at Home or Work/School: no    Feels Unsafe: Not on file    Physical Abuse Present: Not on file      Levert Feinstein, M.D. Ph.D.  [facility] [address] [place], [place] [address] Ph: [phone] Fax: [phone]  CC:  Georgann Housekeeper, MD [address] [place],  [place] [address]  Juluis Rainier, MD (Inactive)

## 2023-10-23 LAB — VITAMIN B12: Vitamin B-12: 446 pg/mL (ref 232–1245)

## 2023-10-23 LAB — VITAMIN D 25 HYDROXY (VIT D DEFICIENCY, FRACTURES): Vit D, 25-Hydroxy: 34.1 ng/mL (ref 30.0–100.0)

## 2023-10-23 LAB — C-REACTIVE PROTEIN: CRP: 16 mg/L — ABNORMAL HIGH (ref 0–10)

## 2023-10-23 LAB — TSH: TSH: 2.79 u[IU]/mL (ref 0.450–4.500)

## 2023-10-23 LAB — CK: Total CK: 79 U/L (ref 32–182)

## 2023-10-23 LAB — RPR: RPR Ser Ql: NONREACTIVE

## 2023-10-23 LAB — SEDIMENTATION RATE: Sed Rate: 31 mm/h (ref 0–32)

## 2023-10-23 LAB — ANA W/REFLEX IF POSITIVE: Anti Nuclear Antibody (ANA): NEGATIVE

## 2023-11-06 DIAGNOSIS — Z124 Encounter for screening for malignant neoplasm of cervix: Secondary | ICD-10-CM | POA: Diagnosis not present

## 2023-11-06 DIAGNOSIS — Z01419 Encounter for gynecological examination (general) (routine) without abnormal findings: Secondary | ICD-10-CM | POA: Diagnosis not present

## 2023-11-06 DIAGNOSIS — N920 Excessive and frequent menstruation with regular cycle: Secondary | ICD-10-CM | POA: Diagnosis not present

## 2023-11-09 ENCOUNTER — Telehealth: Payer: Self-pay | Admitting: Neurology

## 2023-11-09 NOTE — Telephone Encounter (Signed)
 Patient scheduled MRI but will have a different insurance I need to get a PA for.

## 2023-12-02 ENCOUNTER — Ambulatory Visit (INDEPENDENT_AMBULATORY_CARE_PROVIDER_SITE_OTHER): Payer: No Typology Code available for payment source

## 2023-12-02 DIAGNOSIS — R202 Paresthesia of skin: Secondary | ICD-10-CM

## 2023-12-04 ENCOUNTER — Encounter: Payer: Self-pay | Admitting: Neurology

## 2023-12-22 ENCOUNTER — Ambulatory Visit
Admission: EM | Admit: 2023-12-22 | Discharge: 2023-12-22 | Disposition: A | Discharge status: Home / Self Care | Payer: Commercial Managed Care - PPO | Attending: Family Medicine | Admitting: Family Medicine

## 2023-12-22 DIAGNOSIS — H1031 Unspecified acute conjunctivitis, right eye: Secondary | ICD-10-CM

## 2023-12-22 MED ORDER — POLYMYXIN B-TRIMETHOPRIM 10000-0.1 UNIT/ML-% OP SOLN
1.0000 [drp] | OPHTHALMIC | 0 refills | Status: AC
Start: 2023-12-22 — End: 2023-12-29

## 2023-12-22 MED ORDER — AZELASTINE HCL 0.05 % OP SOLN
1.0000 [drp] | Freq: Two times a day (BID) | OPHTHALMIC | 12 refills | Status: DC
Start: 2023-12-22 — End: 2024-06-17

## 2023-12-22 NOTE — ED Triage Notes (Signed)
Pt presents with c/o rt eye redness and discharge X 2 days. Pt states she woke up yesterday morning and had pain.

## 2023-12-22 NOTE — Discharge Instructions (Signed)
Start Polytrim antibiotic eyedrops to the right eye as prescribed.  If there is no improvement after 2 days you can stop this and start antihistamine eyedrops Azelastine..  Warm compresses to the eye as needed.  Follow-up with your PCP if your symptoms do not improve.  Please go to the ER for any worsening symptoms.  Hope you feel better soon!

## 2023-12-22 NOTE — ED Provider Notes (Signed)
[facility]    CSN: [id number] Arrival date & time: 12/22/23  0815      History   Chief Complaint Chief Complaint  Patient presents with   Eye Problem    HPI Tracie Newman is a 33 y.o. female Tracie Newman for eye concern.  Patient reports 2 days of right eye redness/irritation with watery drainage as well as crusting around the eyelashes.  Denies any visual changes, foreign body sensation or photosensitivity.  No injury to the eye.  Does not wear glasses or contacts.  States today she began develop a runny nose otherwise no URI symptoms.  No OTC medications have any since onset.  No other concerns at this time.   Eye Problem Associated symptoms: discharge and redness     Past Medical History:  Diagnosis Date   Anxiety    B12 deficiency    Back pain    GERD (gastroesophageal reflux disease)    History of stomach ulcers    Vitamin D deficiency     Patient Active Problem List   Diagnosis Date Noted   GAD (generalized anxiety disorder) 11/28/2020   At risk for malnutrition 11/28/2020   At risk for dehydration 10/02/2020   At risk for impaired metabolic function 09/13/2020   Insulin resistance 08/30/2020   At risk for diabetes mellitus 08/30/2020   Class 3 severe obesity with serious comorbidity and body mass index (BMI) of 40.0 to 44.9 in adult (HCC) 08/30/2020   Mood disorder (HCC) - Emotional eating 08/16/2020   Gastroesophageal reflux disease 08/16/2020   B12 deficiency 08/16/2020   Vitamin D deficiency 08/16/2020   H/O gastric ulcer 08/16/2020   BMI 40.0-44.9, adult (HCC) 08/16/2020    History reviewed. No pertinent surgical history.  OB History     Gravida  0   Para  0   Term  0   Preterm  0   AB  0   Living  0      SAB  0   IAB  0   Ectopic  0   Multiple  0   Live Births  0            Home Medications    Prior to Admission medications   Medication Sig Start Date End Date Taking? Authorizing Provider  azelastine (OPTIVAR)  0.05 % ophthalmic solution Place 1 drop into the right eye 2 (two) times daily. 12/22/23  Yes Radford Pax, NP  trimethoprim-polymyxin b (POLYTRIM) ophthalmic solution Place 1 drop into the right eye every 4 (four) hours while awake for 7 days. 12/22/23 12/29/23 Yes Radford Pax, NP  ALPRAZolam Prudy Feeler) 0.5 MG tablet Take 1-2 tablets 30 minutes prior to MRI, may repeat once as needed. Must have driver. 10/22/23   Levert Feinstein, MD  DULoxetine (CYMBALTA) 30 MG capsule Take 1 capsule (30 mg total) by mouth daily. 10/22/23   Levert Feinstein, MD  DULoxetine (CYMBALTA) 60 MG capsule Take 1 capsule (60 mg total) by mouth daily. 10/22/23   Levert Feinstein, MD    Family History Family History  Problem Relation Age of Onset   Obesity Mother    Heart disease Father    Sleep apnea Father    Obesity Father     Social History Social History   Tobacco Use   Smoking status: Never   Smokeless tobacco: Never  Vaping Use   Vaping status: Never Used  Substance Use Topics   Alcohol use: Yes    Comment: occassionally  Drug use: Never     Allergies   Bupropion   Review of Systems Review of Systems  Eyes:  Positive for discharge and redness.     Physical Exam Triage Vital Signs ED Triage Vitals [12/22/23 0900]  Encounter Vitals Group     BP 118/81     Systolic BP Percentile      Diastolic BP Percentile      Pulse Rate 92     Resp 15     Temp 98.9 F (37.2 C)     Temp Source Oral     SpO2 95 %     Weight      Height      Head Circumference      Peak Flow      Pain Score 3     Pain Loc      Pain Education      Exclude from Growth Chart    No data found.  Updated Vital Signs BP 118/81 (BP Location: Left Arm)   Pulse 92   Temp 98.9 F (37.2 C) (Oral)   Resp 15   LMP 12/20/2023 (Exact Date)   SpO2 95%   Visual Acuity Right Eye Distance:   Left Eye Distance:   Bilateral Distance:    Right Eye Near:   Left Eye Near:    Bilateral Near:     Physical Exam Vitals and nursing note  reviewed.  Constitutional:      General: She is not in acute distress.    Appearance: Normal appearance. She is not ill-appearing.  HENT:     Head: Normocephalic and atraumatic.  Eyes:     General: Lids are normal.        Right eye: No foreign body or hordeolum.     Extraocular Movements: Extraocular movements intact.     Conjunctiva/sclera:     Right eye: Right conjunctiva is injected. No chemosis, exudate or hemorrhage.    Pupils: Pupils are equal, round, and reactive to light.     Comments: Watery discharge noted  Cardiovascular:     Rate and Rhythm: Normal rate.  Pulmonary:     Effort: Pulmonary effort is normal.  Skin:    General: Skin is warm and dry.  Neurological:     General: No focal deficit present.     Mental Status: She is alert and oriented to person, place, and time.  Psychiatric:        Mood and Affect: Mood normal.        Behavior: Behavior normal.      UC Treatments / Results  Labs (all labs ordered are listed, but only abnormal results are displayed) Labs Reviewed - No data to display  EKG   Radiology No results found.  Procedures Procedures (including critical care time)  Medications Ordered in UC Medications - No data to display  Initial Impression / Assessment and Plan / UC Course  I have reviewed the triage vital signs and the nursing notes.  Pertinent labs & imaging results that were available during my care of the patient were reviewed by me and considered in my medical decision making (see chart for details).     Reviewed exam and symptoms with patient.  No red flags.  Discussed different types of conjunctivitis.  Will cover for potential bacterial process with Polytrim.  Patient instructed if no improvement after 2 days she can stop this and start prescribed antihistamine eyedrops.  Encouraged warm compresses as needed.  PCP follow-up if symptoms  do not improve.  ER precautions reviewed. Final Clinical Impressions(s) / UC Diagnoses    Final diagnoses:  Acute conjunctivitis of right eye, unspecified acute conjunctivitis type     Discharge Instructions      Start Polytrim antibiotic eyedrops to the right eye as prescribed.  If there is no improvement after 2 days you can stop this and start antihistamine eyedrops Azelastine..  Warm compresses to the eye as needed.  Follow-up with your PCP if your symptoms do not improve.  Please go to the ER for any worsening symptoms.  Hope you feel better soon!    ED Prescriptions     Medication Sig Dispense Auth. Provider   trimethoprim-polymyxin b (POLYTRIM) ophthalmic solution Place 1 drop into the right eye every 4 (four) hours while awake for 7 days. 10 mL Radford Pax, NP   azelastine (OPTIVAR) 0.05 % ophthalmic solution Place 1 drop into the right eye 2 (two) times daily. 6 mL Radford Pax, NP      PDMP not reviewed this encounter.   Radford Pax, NP 12/22/23 [phone]

## 2023-12-23 ENCOUNTER — Ambulatory Visit: Payer: Self-pay

## 2024-01-04 ENCOUNTER — Encounter (INDEPENDENT_AMBULATORY_CARE_PROVIDER_SITE_OTHER): Payer: Self-pay | Admitting: Otolaryngology

## 2024-01-11 ENCOUNTER — Encounter (INDEPENDENT_AMBULATORY_CARE_PROVIDER_SITE_OTHER): Payer: Self-pay | Admitting: Otolaryngology

## 2024-02-16 ENCOUNTER — Ambulatory Visit (INDEPENDENT_AMBULATORY_CARE_PROVIDER_SITE_OTHER): Admitting: Audiology

## 2024-02-16 ENCOUNTER — Encounter (INDEPENDENT_AMBULATORY_CARE_PROVIDER_SITE_OTHER): Payer: Self-pay

## 2024-02-16 ENCOUNTER — Ambulatory Visit (INDEPENDENT_AMBULATORY_CARE_PROVIDER_SITE_OTHER): Admitting: Physician Assistant

## 2024-02-16 VITALS — BP 107/72 | HR 76 | Ht 69.0 in | Wt 275.0 lb

## 2024-02-16 DIAGNOSIS — H9190 Unspecified hearing loss, unspecified ear: Secondary | ICD-10-CM

## 2024-02-16 DIAGNOSIS — Z0110 Encounter for examination of ears and hearing without abnormal findings: Secondary | ICD-10-CM | POA: Diagnosis not present

## 2024-02-16 DIAGNOSIS — H93293 Other abnormal auditory perceptions, bilateral: Secondary | ICD-10-CM

## 2024-02-16 DIAGNOSIS — H9193 Unspecified hearing loss, bilateral: Secondary | ICD-10-CM

## 2024-02-16 NOTE — Progress Notes (Signed)
 Dear Dr. Tomasa Rand, Here is my assessment for our mutual patient, Linda Grimmer. Thank you for allowing me the opportunity to care for your patient. Please do not hesitate to contact me should you have any other questions. Sincerely, Burna Forts PA-C  Otolaryngology Clinic Note Referring provider: Dr. Tomasa Rand HPI:  Karysa Heft is a 33 y.o. female kindly referred by Dr. Tomasa Rand   The patient is a 33 year old female seen in our office for evaluation of hearing difficulty.  The patient notes that over the last year she has had some difficulty hearing.  She feels like she has to turn the TV up louder, she is told from people around her that she speaks very loudly.  She notes that it seems symmetric, she notes some intermittent ringing in her ears, none today.  She denies any pain in her ears, she denies any upper respiratory infections, no seasonal allergy symptoms although she does report a history of seasonal allergies.  She denies any pertinent ear related history including trauma, surgery, or recurrent ear infections.    Independent Review of Additional Tests or Records:  Audiological evaluation on 02/16/2024  Normal hearing     PMH/Meds/All/SocHx/FamHx/ROS:   Past Medical History:  Diagnosis Date   Anxiety    B12 deficiency    Back pain    GERD (gastroesophageal reflux disease)    History of stomach ulcers    Vitamin D deficiency      History reviewed. No pertinent surgical history.  Family History  Problem Relation Age of Onset   Obesity Mother    Heart disease Father    Sleep apnea Father    Obesity Father      Social Connections: Not on file      Current Outpatient Medications:    sertraline (ZOLOFT) 25 MG tablet, Take 25 mg by mouth daily., Disp: , Rfl:    ZEPBOUND 5 MG/0.5ML Pen, Inject 0.5 mLs into the skin once a week., Disp: , Rfl:    ALPRAZolam (XANAX) 0.5 MG tablet, Take 1-2 tablets 30 minutes prior to MRI, may repeat once as needed. Must have  driver. (Patient not taking: Reported on 02/16/2024), Disp: 3 tablet, Rfl: 0   azelastine (OPTIVAR) 0.05 % ophthalmic solution, Place 1 drop into the right eye 2 (two) times daily. (Patient not taking: Reported on 02/16/2024), Disp: 6 mL, Rfl: 12   DULoxetine (CYMBALTA) 30 MG capsule, Take 1 capsule (30 mg total) by mouth daily. (Patient not taking: Reported on 02/16/2024), Disp: 30 capsule, Rfl: 0   DULoxetine (CYMBALTA) 60 MG capsule, Take 1 capsule (60 mg total) by mouth daily. (Patient not taking: Reported on 02/16/2024), Disp: 30 capsule, Rfl: 11   Physical Exam:   BP 107/72   Pulse 76   Ht 5\' 9"  (1.753 m)   Wt 275 lb (124.7 kg)   SpO2 98%   BMI 40.61 kg/m   Pertinent Findings  CN II-XII intact  Bilateral EAC clear and TM intact with well pneumatized middle ear spaces Weber 512: equal Rinne 512: AC > BC b/l  Anterior rhinoscopy: Septum midline; bilateral inferior turbinates with no hypertrophy No lesions of oral cavity/oropharynx; dentition wnl No obviously palpable neck masses/lymphadenopathy/thyromegaly No respiratory distress or stridor  Seprately Identifiable Procedures:  None  Impression & Plans:  Abbrielle Stammen is a 33 y.o. female with the following   Difficulty hearing-  33 year old female presents today for audiological evaluation.  Her audiogram is essentially normal, physical exam with no concerning findings.  No apparent etiology  for perceived difficulty hearing.  She is very happy to hear that her hearing test was normal, she may return at any point for repeat evaluation as needed.  She verbalized understanding and agreement to today's plan had no further questions or concerns.   - f/u PRN   Thank you for allowing me the opportunity to care for your patient. Please do not hesitate to contact me should you have any other questions.  Sincerely, Belma Boxer PA-C [facility] Phone: [phone] Fax: [phone]  02/16/2024, 1:50 PM

## 2024-02-16 NOTE — Progress Notes (Signed)
 Marland Kitchen

## 2024-02-16 NOTE — Progress Notes (Signed)
  [address] [place], [place] [address] [phone]  Audiological Evaluation    Name: Tracie Newman     DOB:   1991/10/25      MRN:   [id number]                                                                                     Service Date: 02/16/2024     Accompanied by: unaccompanied    Patient comes today after Lorane Rocker, PA-C sent a referral for a hearing evaluation due to concerns with hearing loss.   Symptoms Yes Details  Hearing loss  [x]  Bilateral hearing loss- longstanding - cannot understand what others seem to hear/understand well, -a few weeks ago had a muffled sensation and tinnitus  Tinnitus  [x]  -a few weeks ago had a muffled sensation and tinnitus  Ear pain/ infections/pressure  []    Balance problems  [x]  Thinks she always been off balance- sometimes it goes away and comes back mainly off balance , but sometimes has had the spinning sensation.  Noise exposure history  [x]  concerts  Previous ear surgeries  []    Family history of hearing loss  []    Amplification  []    Other  []      Otoscopy: Right ear: Clear external ear canals and notable landmarks visualized on the tympanic membrane. Left ear:  Clear external ear canals and notable landmarks visualized on the tympanic membrane.  Tympanometry: Right ear: Type A- Normal external ear canal volume with normal middle ear pressure and tympanic membrane compliance Left ear: Type A- Normal external ear canal volume with normal middle ear pressure and tympanic membrane compliance  Pure tone Audiometry: Right ear- Normal hearing from [phone] Hz.  Left ear-  Normal hearing from [phone] Hz.   Speech Audiometry: Right ear- Speech Reception Threshold (SRT) was obtained at 5 dBHL. Left ear-Speech Reception Threshold (SRT) was obtained at 5 dBHL.   Word Recognition Score Tested using NU-6 (MLV) Right ear: 92% was obtained at a presentation level of 50 dBHL with contralateral masking which is  deemed as  excellent. Left ear: 96% was obtained at a presentation level of 50 dBHL with contralateral masking which is deemed as  excellent.   The hearing test results were completed under headphones and results are deemed to be of good reliability. Test technique:  conventional     Recommendations: Follow up with ENT as scheduled for today. Return for a hearing evaluation if concerns with hearing changes arise or per MD recommendation.   Tracie Newman, AUD

## 2024-06-14 ENCOUNTER — Encounter: Payer: Self-pay | Admitting: Physician Assistant

## 2024-06-14 ENCOUNTER — Ambulatory Visit: Admitting: Physician Assistant

## 2024-06-14 VITALS — BP 111/81

## 2024-06-14 DIAGNOSIS — L905 Scar conditions and fibrosis of skin: Secondary | ICD-10-CM

## 2024-06-14 NOTE — Progress Notes (Signed)
   New Patient Visit   Subjective  Tracie Newman is a 33 y.o. female who presents for the following: Pt states she has been getting a bump inside her right ear hole. Pt states sometimes it swells and when it does it can be painful. Pt states she has used some antibotic cream that was previously prescribed and she is not sure what it was. This has been going on over . Pt states she did have a ear ring back that got stuck in her ear a few years ago.     The following portions of the chart were reviewed this encounter and updated as appropriate: medications, allergies, medical history  Review of Systems:  No other skin or systemic complaints except as noted in HPI or Assessment and Plan.  Objective  Well appearing patient in no apparent distress; mood and affect are within normal limits.  A focused examination was performed of the following areas: right ear lobe  Relevant exam findings are noted in the Assessment and Plan.    Assessment & Plan   SCAR Exam: scar inside right ear piercing  site  Benign-appearing.  Observation.  Call clinic for new or changing lesions. Recommend daily broad spectrum sunscreen SPF 30+, reapply every 2 hours as needed. Treatment: Recommend Serica moisturizing scar formula cream every night or [employer] brand or Mederma silicone scar sheet every night for the first year after a scar appears to help with scar remodeling if desired. Scars remodel on their own for a full year and will gradually improve in appearance over time.   We will refer pt to plastic surgeon.  SCAR CONDITIONS AND FIBROSIS OF SKIN   Related Procedures Ambulatory referral to Plastic Surgery  Return if symptoms worsen or fail to improve.  I, Doyce Pan, CMA, am acting as scribe for Romayne Ticas K, PA-C.   Documentation: I have reviewed the above documentation for accuracy and completeness, and I agree with the above.  Tobby Fawcett K, PA-C

## 2024-06-14 NOTE — Patient Instructions (Signed)

## 2024-06-17 ENCOUNTER — Ambulatory Visit

## 2024-06-17 VITALS — BP 119/77 | HR 79 | Ht 69.0 in | Wt 259.0 lb

## 2024-06-17 DIAGNOSIS — H938X2 Other specified disorders of left ear: Secondary | ICD-10-CM

## 2024-06-17 DIAGNOSIS — H6192 Disorder of left external ear, unspecified: Secondary | ICD-10-CM

## 2024-06-17 NOTE — Progress Notes (Signed)
 NAME: Tracie Newman  MRN: [id number]  DOB: 1991/01/08    Referring physician:??Cunningham, Alexus, PA  PCP:?Cunningham, Alexus, PA    CHIEF COMPLAINT:Recurrent left earlobe mass  HPI:?  This is a 33 y.o. year old female with PMH and PSH as below who presents in consultation for recurrent left earlobe mass.   It has been present for a year. History of piercing in the area. They left a piece of metal, patient formed a granuloma, that was removed but the mass recurred.   Is the keloid painful, itchy, or tender? Yes, tender  Has the size changed over time? No   Previous treatments? (steroid injections, silicone sheets, surgery)? Excision in the OSH.   Do any family members have a history of keloids or hypertrophic scars? No  Patient denies h/o smoking, HTN, coagulopathies, or thyroid  conditions. Not on any anticoagulation.     Family History:   Family history is negative for bleeding/clotting disorders, problems with anesthesia, connective tissue disorders.     Social History:   Social History   Socioeconomic History   Marital status: Single    Spouse name: Not on file   Number of children: Not on file   Years of education: Not on file   Highest education level: Not on file  Occupational History   Occupation: Attorney    Comment: [employer]  Tobacco Use   Smoking status: Never   Smokeless tobacco: Never  Vaping Use   Vaping status: Never Used  Substance and Sexual Activity   Alcohol use: Yes    Comment: occassionally   Drug use: Never   Sexual activity: Yes    Comment: sometimes uses condoms and birth control  Other Topics Concern   Not on file  Social History Narrative   Not on file   Social Drivers of Health   Financial Resource Strain: Not on file  Food Insecurity: Not on file  Transportation Needs: Not on file  Physical Activity: Not on file  Stress: Not on file  Social Connections: Not on file    Smoker/Vape No  Recreational Drug use:  No   PMH:  Past Medical History:  Diagnosis Date   Anxiety    B12 deficiency    Back pain    GERD (gastroesophageal reflux disease)    History of stomach ulcers    Vitamin D  deficiency       PSH:  No past surgical history on file.    MEDICATIONS:?   Current Outpatient Medications:    sertraline (ZOLOFT) 25 MG tablet, Take 25 mg by mouth daily., Disp: , Rfl:    ZEPBOUND 5 MG/0.5ML Pen, Inject 0.5 mLs into the skin once a week., Disp: , Rfl:     ALLERGIES:?  is allergic to bupropion.    REVIEW OF SYSTEMS:  Review of Systems  Successful      VITALS:  Blood pressure 119/77, pulse 79, height 5' 9 (1.753 m), weight 259 lb (117.5 kg), SpO2 99%.   BP 119/77 (BP Location: Left Arm, Patient Position: Sitting, Cuff Size: Large)   Pulse 79   Ht 5' 9 (1.753 m)   Wt 259 lb (117.5 kg)   SpO2 99%   BMI 38.25 kg/m   Body mass index is 38.25 kg/m.  General: Well appearing, no apparent distress.  Neuro: A&Ox3 HEENT: Normocephalic, atraumatic, PERRL.  Mass/s location & Size: Left earlobe 0.5 cm x 0.5 cm raised, shiny, firm, or erythematous, edges well-defined not spreading beyond the original wound,  no ulceration, infection, or secondary changes.  Not fixed to underlying structures, mobile.    ASSESSMENT/PLAN  Assessment & Plan    Today we discussed the risks, benefits and alternatives to mass excision. We discussed the alternatives which include continued observation; however, I told the patient that I do not believe this will resolve on their own. We discussed non-surgical management. We discussed the risks of excision which include infection, bleeding, damage to surrounding healthy tissue, unsatisfactory aesthetic result, deformities and need for further surgery. We also discussed the risks of wound separation and recurrent abnormal scarring which includes the recurrence of granuloma. We discussed the risks of anesthesia which will be further elaborated on by our anesthesia  colleagues. The patient has a good understanding of all the risks and benefits, postoperative course and care. We obtained pictures and will give the patient quotes. All questions were answered.     The plan agreed upon includes: Excision of recurrent earlobe mass in OR due to recurrence.  I spent 30 minutes counseling the patient and discussing plan of care.  Aslan Himes M Yohannes Waibel [facility]

## 2024-08-01 ENCOUNTER — Ambulatory Visit: Admitting: Physician Assistant

## 2024-08-22 ENCOUNTER — Ambulatory Visit (INDEPENDENT_AMBULATORY_CARE_PROVIDER_SITE_OTHER)

## 2024-08-22 ENCOUNTER — Encounter: Payer: Self-pay | Admitting: Podiatry

## 2024-08-22 ENCOUNTER — Ambulatory Visit: Admitting: Podiatry

## 2024-08-22 DIAGNOSIS — M2142 Flat foot [pes planus] (acquired), left foot: Secondary | ICD-10-CM

## 2024-08-22 DIAGNOSIS — M2141 Flat foot [pes planus] (acquired), right foot: Secondary | ICD-10-CM

## 2024-08-22 DIAGNOSIS — M7750 Other enthesopathy of unspecified foot: Secondary | ICD-10-CM

## 2024-08-22 DIAGNOSIS — M7751 Other enthesopathy of right foot: Secondary | ICD-10-CM

## 2024-08-22 DIAGNOSIS — S93401A Sprain of unspecified ligament of right ankle, initial encounter: Secondary | ICD-10-CM | POA: Diagnosis not present

## 2024-08-22 DIAGNOSIS — M722 Plantar fascial fibromatosis: Secondary | ICD-10-CM

## 2024-08-22 NOTE — Patient Instructions (Signed)
Ankle Sprain, Phase I Rehab An ankle sprain is an injury to the tissues that connect bone to bone (ligaments) in your ankle. Ankle sprains can cause stiffness, loss of motion, and loss of strength. Ask your health care provider which exercises are safe for you. Do exercises exactly as told by your provider and adjust them as directed. It is normal to feel mild stretching, pulling, tightness, or discomfort as you do these exercises. Stop right away if you feel sudden pain or your pain gets worse. Do not begin these exercises until told by your provider. Stretching and range-of-motion exercises These exercises warm up your muscles and joints. They can improve the movement and flexibility of your lower leg and ankle. They also help to relieve pain and stiffness. Gastroc and soleus stretch This exercise is also called a calf stretch. It stretches the muscles in the back of the lower leg. These muscles are the gastrocnemius, or gastroc, and the soleus. Sit on the floor with your left / right leg extended. Loop a belt or towel around the ball of your left / right foot. The ball of your foot is on the walking surface, right under your toes. Keep your left / right ankle and foot relaxed and keep your knee straight. Use the belt or towel to pull your foot toward you. You should feel a gentle stretch behind your calf or knee in your gastroc muscle. Hold this position for __________ seconds, then release to the starting position. Repeat the exercise with your knee bent. You can put a pillow or a rolled bath towel under your knee to support it. You should feel a stretch deep in your calf in the soleus muscle or at your Achilles tendon. Repeat __________ times. Complete this exercise __________ times a day. Ankle alphabet  Sit with your left / right leg supported at the lower leg. Do not rest your foot on anything. Make sure your foot has room to move freely. Think of your left / right foot as a  paintbrush. Move your foot to trace each letter of the alphabet in the air. Keep your hip and knee still while you trace. Make the letters as large as you can without feeling discomfort. Trace every letter from A to Z. Repeat __________ times. Complete this exercise __________ times a day. Strengthening exercises These exercises build strength and endurance in your ankle and lower leg. Endurance is the ability to use your muscles for a long time, even after they get tired. Ankle dorsiflexion  Secure a rubber exercise band or tube to an object, such as a table leg, that will stay still when the band is pulled. Secure the other end around your left / right foot. Sit on the floor facing the object, with your left / right leg extended. The band or tube should be slightly tense when your foot is relaxed. Slowly bring your foot toward you, bringing the top of your foot toward your shin (dorsiflexion), and pulling the band tighter. Hold this position for __________ seconds. Slowly return your foot to the starting position. Repeat __________ times. Complete this exercise __________ times a day. Ankle plantar flexion  Sit on the floor with your left / right leg extended. Loop a rubber exercise tube or band around the ball of your left / right foot. The ball of your foot is on the walking surface, right under your toes. Hold the ends of the band or tube in your hands. The band or tube should be   slightly tense when your foot is relaxed. Slowly point your foot and toes downward to tilt the top of your foot away from your shin (plantar flexion). Hold this position for __________ seconds. Slowly return your foot to the starting position. Repeat __________ times. Complete this exercise __________ times a day. Ankle eversion  Sit on the floor with your legs straight out in front of you. Loop a rubber exercise band or tube around the ball of your left / right foot. The ball of your foot is on the walking  surface, right under your toes. Hold the ends of the band in your hands or secure the band to a stable object. The band or tube should be slightly tense when your foot is relaxed. Slowly push your foot outward, away from your other leg (eversion). Hold this position for __________ seconds. Slowly return your foot to the starting position. Repeat __________ times. Complete this exercise __________ times a day. This information is not intended to replace advice given to you by your health care provider. Make sure you discuss any questions you have with your health care provider. Document Revised: 08/13/2022 Document Reviewed: 08/13/2022 Elsevier Patient Education  2024 [employer] Inc[employer]  

## 2024-08-22 NOTE — Progress Notes (Signed)
  Subjective:  Patient ID: Tracie Newman Public, female    DOB: 1991/10/05,   MRN: [id number]  Chief Complaint  Patient presents with   Foot Pain    I have a lot of foot pain.  I sprained my ankle a few years ago.  It seems like my ankle gets really sore when I walk a lot.  (Ball of foot and arch bilateral; medial ankle right)    33 y.o. female presents for concern as above. Relates this has been ongoing for several months.  She is an Pensions consultant.  She does not relate any particular extreme activity.  Does relate that she does not wear the best shoes.  She does have a history of ankle sprain on the right ankle and will occasionally get pain in this area.  Denies any treatments.. Denies any other pedal complaints. Denies n/v/f/c.   Past Medical History:  Diagnosis Date   Anxiety    B12 deficiency    Back pain    GERD (gastroesophageal reflux disease)    History of stomach ulcers    Vitamin D  deficiency     Objective:  Physical Exam: Vascular: DP/PT pulses 2/4 bilateral. CFT <3 seconds. Normal hair growth on digits. No edema.  Skin. No lacerations or abrasions bilateral feet.  Musculoskeletal: MMT 5/5 bilateral lower extremities in DF, PF, Inversion and Eversion. Deceased ROM in DF of ankle joint.  Tender over the ATFL.  No tenderness over the peroneal tendons.  No tenderness with manual muscle testing.  Negative anterior drawer.  Mild pes planus noted. Neurological: Sensation intact to light touch.   Assessment:   1. Bilateral pes planus   2. Mild ankle sprain, right, initial encounter      Plan:  Patient was evaluated and treated and all questions answered. X-rays reviewed and discussed with patient. No acute fractures or dislocations noted.  Mild degenerative changes noted to right subtalar joint. Mild pes planus bilateral.  Discussed ankle sprain right  and treatment options at length with patient Discussed stretching exercises and provided handout. Discussed supportive shoes and  inserts and information given.  Dispensed Tri-Lock ankle brace. Discussed that if the symptoms do not improve can consider PT/MRI. Patient to return as needed   Asberry Failing, DPM

## 2024-09-13 ENCOUNTER — Ambulatory Visit: Admitting: Physician Assistant

## 2024-09-13 ENCOUNTER — Encounter: Payer: Self-pay | Admitting: Physician Assistant

## 2024-09-13 VITALS — BP 121/76

## 2024-09-13 DIAGNOSIS — L700 Acne vulgaris: Secondary | ICD-10-CM

## 2024-09-13 DIAGNOSIS — L578 Other skin changes due to chronic exposure to nonionizing radiation: Secondary | ICD-10-CM

## 2024-09-13 DIAGNOSIS — Z1283 Encounter for screening for malignant neoplasm of skin: Secondary | ICD-10-CM

## 2024-09-13 DIAGNOSIS — W908XXA Exposure to other nonionizing radiation, initial encounter: Secondary | ICD-10-CM | POA: Diagnosis not present

## 2024-09-13 DIAGNOSIS — D229 Melanocytic nevi, unspecified: Secondary | ICD-10-CM

## 2024-09-13 DIAGNOSIS — L821 Other seborrheic keratosis: Secondary | ICD-10-CM

## 2024-09-13 DIAGNOSIS — D1801 Hemangioma of skin and subcutaneous tissue: Secondary | ICD-10-CM

## 2024-09-13 DIAGNOSIS — L732 Hidradenitis suppurativa: Secondary | ICD-10-CM

## 2024-09-13 DIAGNOSIS — L814 Other melanin hyperpigmentation: Secondary | ICD-10-CM | POA: Diagnosis not present

## 2024-09-13 MED ORDER — TRETINOIN 0.025 % EX CREA: TOPICAL_CREAM | Freq: Every day | CUTANEOUS | 6 refills | Status: AC

## 2024-09-13 NOTE — Patient Instructions (Addendum)

## 2024-09-13 NOTE — Progress Notes (Signed)
 Follow-Up Visit   Subjective  Tracie Newman is a 33 y.o. female ESTABLISHED PATIENT who presents for the following: Skin Cancer Screening and Full Body Skin Exam - No history of skin cancer, no family history of melanoma. She has a history of HS. She washes with Hibiclens.  The patient presents for Total-Body Skin Exam (TBSE) for skin cancer screening and mole check. The patient has spots, moles and lesions to be evaluated, some may be new or changing and the patient may have concern these could be cancer.    The following portions of the chart were reviewed this encounter and updated as appropriate: medications, allergies, medical history  Review of Systems:  No other skin or systemic complaints except as noted in HPI or Assessment and Plan.  Objective  Well appearing patient in no apparent distress; mood and affect are within normal limits.  A full examination was performed including scalp, head, eyes, ears, nose, lips, neck, chest, axillae, abdomen, back, buttocks, bilateral upper extremities, bilateral lower extremities, hands, feet, fingers, toes, fingernails, and toenails. All findings within normal limits unless otherwise noted below.   Relevant physical exam findings are noted in the Assessment and Plan.    Assessment & Plan   SKIN CANCER SCREENING PERFORMED TODAY.  ACTINIC DAMAGE - Chronic condition, secondary to cumulative UV/sun exposure - diffuse scaly erythematous macules with underlying dyspigmentation - Recommend daily broad spectrum sunscreen SPF 30+ to sun-exposed areas, reapply every 2 hours as needed.  - Staying in the shade or wearing long sleeves, sun glasses (UVA+UVB protection) and wide brim hats (4-inch brim around the entire circumference of the hat) are also recommended for sun protection.  - Call for new or changing lesions.  LENTIGINES, SEBORRHEIC KERATOSES, HEMANGIOMAS - Benign normal skin lesions - Benign-appearing - Call for any  changes  MELANOCYTIC NEVI - Tan-brown and/or pink-flesh-colored symmetric macules and papules - Benign appearing on exam today - Observation - Call clinic for new or changing moles - Recommend daily use of broad spectrum spf 30+ sunscreen to sun-exposed areas.   HIDRADENITIS SUPPURATIVA Exam: scars and resolving papules    Hidradenitis Suppurativa is a chronic; persistent; non-curable, but treatable condition due to abnormal inflamed sweat glands in the body folds (axilla, inframammary, groin, medial thighs), causing recurrent painful draining cysts and scarring. It can be associated with severe scarring acne and cysts; also abscesses and scarring of scalp. The goal is control and prevention of flares, as it is not curable. Scars are permanent and can be thickened. Treatment may include daily use of topical medication and oral antibiotics.  Oral isotretinoin may also be helpful.  For some cases, Humira or Cosentyx (biologic injections) may be prescribed to decrease the inflammatory process and prevent flares.  When indicated, inflamed cysts may also be treated surgically.  Treatment Plan: Recommend alternating Hibiclens with Dial antibacterial soap and Panoxyl wash.   Her rheumatologist is starting her on Humira for her arthritis. Advised her it will help her HS also.  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       SCREENING EXAM FOR SKIN CANCER   ACTINIC SKIN DAMAGE   LENTIGINES   SEBORRHEIC KERATOSIS   CHERRY ANGIOMA   MULTIPLE BENIGN NEVI   ACNE VULGARIS   Return if symptoms worsen or fail to improve.  I, Roseline Hutchinson, CMA, am acting as scribe for  Traci Plemons K, PA-C .   Documentation: I have reviewed the above documentation for accuracy and completeness, and I agree with the above.  Shiri Hodapp K, PA-C

## 2024-10-18 ENCOUNTER — Encounter: Admitting: Student

## 2024-10-20 ENCOUNTER — Other Ambulatory Visit: Payer: Self-pay

## 2024-10-20 ENCOUNTER — Encounter (HOSPITAL_BASED_OUTPATIENT_CLINIC_OR_DEPARTMENT_OTHER): Payer: Self-pay

## 2024-10-21 ENCOUNTER — Ambulatory Visit: Admitting: Student

## 2024-10-21 VITALS — BP 122/80 | HR 78 | Temp 98.4°F | Ht 69.0 in | Wt 256.2 lb

## 2024-10-21 DIAGNOSIS — H938X2 Other specified disorders of left ear: Secondary | ICD-10-CM | POA: Diagnosis not present

## 2024-10-21 DIAGNOSIS — H6192 Disorder of left external ear, unspecified: Secondary | ICD-10-CM

## 2024-10-21 MED ORDER — ONDANSETRON HCL 4 MG PO TABS: 4.0000 mg | ORAL_TABLET | Freq: Three times a day (TID) | ORAL | 0 refills | Status: AC | PRN

## 2024-10-21 MED ORDER — OXYCODONE HCL 5 MG PO TABS: 5.0000 mg | ORAL_TABLET | Freq: Four times a day (QID) | ORAL | 0 refills | Status: AC | PRN

## 2024-10-21 NOTE — H&P (View-Only) (Signed)
 "    Patient ID: Tracie Newman, female    DOB: 1990/11/29, 33 y.o.   MRN: [id number]  Chief Complaint  Patient presents with   Pre-op Exam      ICD-10-CM   1. Earlobe lesion, left  H61.92        History of Present Illness: Tracie Newman is a 33 y.o.  female  with a history of left earlobe mass.  She presents for preoperative evaluation for upcoming procedure, excision of left earlobe mass, scheduled for 10/28/2024 with Dr. Montorfano.  The patient states that she has never had general anesthesia before.  Patient reports that she had an endoscopy and did okay with the anesthesia then.  She denies any history of cardiac disease.  She denies taking any blood thinners.  Patient reports she is not a smoker.  Patient denies taking any birth control or hormone replacement.  She denies any history of greater than 3 miscarriages.  She denies any personal or family history of blood clots or clotting diseases.  She denies any recent surgeries, traumas or infections.  She denies any history of stroke or heart attack.  She denies any history of Crohn's disease or ulcerative colitis.  She denies any history of cancer.  She denies any varicosities to her lower extremities.  She denies any recent fevers, chills or changes in her health.  Patient was inquiring if this could be done under twilight sedation.  I discussed with her that I would have to check with Dr. Montorfano and with the anesthesia team to see if this would be possible.  I did discuss with her though that she may need general anesthesia and may need to be intubated for the general anesthesia.  Also encouraged her to discuss this with the anesthesia team the day of surgery.  She expressed understanding.  Summary of Previous Visit: Patient was seen for initial consult by Dr. Montorfano on 06/17/2024.  At this visit, patient reported that she had a recurrent left earlobe mass.  She stated that it was present for about a year and had history of a  piercing to the area.  A piece of metal was left and patient formed a granuloma that was removed, but the mass reoccurred.  On exam it was noted that the left earlobe had a 0.5 x 0.5 cm raised, shiny, firm mass with edges well-defined is not spreading beyond the original wound.  The plan was to move forward with excision of recurrent earlobe mass in the OR due to reoccurrence.  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  Social History: Social History   Socioeconomic History   Marital status: Single    Spouse name: Not on file   Number of children: Not on file   Years of education: Not on file   Highest education level: Not on file  Occupational History   Occupation: Attorney    Comment: [employer]  Tobacco Use   Smoking status: Never   Smokeless tobacco:  Never  Vaping Use   Vaping status: Never Used  Substance and Sexual Activity   Alcohol use: Yes    Comment: occassionally   Drug use: Never   Sexual activity: Yes    Comment: sometimes uses condoms and birth control  Other Topics Concern   Not on file  Social History Narrative   Not on file   Social Drivers of Health   Tobacco Use: Low Risk (10/20/2024)   Patient History    Smoking Tobacco Use: Never    Smokeless Tobacco Use: Never    Passive Exposure: Not on file  Financial Resource Strain: Not on file  Food Insecurity: Not on file  Transportation Needs: Not on file  Physical Activity: Not on file  Stress: Not on file  Social Connections: Not on file  Intimate  Partner Violence: Low Risk (04/08/2023)   Received from [facility]   Interpersonal Safety    Feels UN-safe at Home or Work/School: no    Feels Unsafe: Not on file    Physical Abuse Present: Not on file  Depression (PHQ2-9): Not on file  Alcohol Screen: Not on file  Housing: Not on file  Utilities: Not on file  Health Literacy: Not on file    Family History: Family History  Problem Relation Age of Onset   Obesity Mother    Heart disease Father    Sleep apnea Father    Obesity Father     Review of Systems: Denies any recent fevers, chills or changes in her health  Physical Exam: Vital Signs BP 122/80   Pulse 78   Temp 98.4 F (36.9 C)   Ht 5' 9 (1.753 m)   Wt 256 lb 3.2 oz (116.2 kg)   SpO2 98%   BMI 37.83 kg/m   Physical Exam  Constitutional:      General: Not in acute distress.    Appearance: Normal appearance. Not ill-appearing.  HENT:     Head: Normocephalic and atraumatic.  Mass to the left ear noted. Eyes:     Pupils: Pupils are equal, round Neck:     Musculoskeletal: Normal range of motion.  Cardiovascular:     Rate and Rhythm: Normal rate Pulmonary:     Effort: Pulmonary effort is normal. No respiratory distress.  Musculoskeletal: Normal range of motion.  Skin:    Findings: No erythema or rash.  Neurological:     Mental Status: Alert and oriented to person, place, and time. Mental status is at baseline.  Psychiatric:        Mood and Affect: Mood normal.        Behavior: Behavior normal.    Assessment/Plan: The patient is scheduled for excision of left earlobe mass with Dr. Montorfano.  Risks, benefits, and alternatives of procedure discussed, questions answered and consent obtained.    Smoking Status: Non-smoker; Counseling Given?  N/A  Caprini Score: 3; Risk Factors include: BMI > 25, and length of planned surgery. Recommendation for mechanical prophylaxis. Encourage early ambulation.   Pictures obtained: @consult   Post-op  Rx sent to pharmacy: Oxycodone , Zofran   Instructed the patient to hold her Zepbound at least 1 week prior to surgery.  Discussed with her to hold any multivitamins or supplements at least 1 week prior to surgery.  She expressed understanding.  Patient was provided with the General Surgical Risk consent document and Pain Medication Agreement prior to their appointment.  They had adequate time to read through the risk consent documents and Pain Medication  Agreement. We also discussed them in person together during this preop appointment. All of their questions were answered to their satisfaction.  Recommended calling if they have any further questions.  Risk consent form and Pain Medication Agreement to be scanned into patient's chart.  The consent was obtained with risks and complications reviewed which included bleeding, pain, scar, infection and the risk of anesthesia.  The patients questions were answered to the patients expressed satisfaction.    Electronically signed by: Estefana FORBES Peck, PA-C 10/21/2024 12:53 PM      [1]  Allergies Allergen Reactions   Bupropion Nausea Only  [2]  Current Outpatient Medications:    ondansetron (ZOFRAN) 4 MG tablet, Take 1 tablet (4 mg total) by mouth every 8 (eight) hours as needed for up to 10 doses for nausea or vomiting., Disp: 10 tablet, Rfl: 0   oxyCODONE (ROXICODONE) 5 MG immediate release tablet, Take 1 tablet (5 mg total) by mouth every 6 (six) hours as needed for up to 5 doses for severe pain (pain score 7-10)., Disp: 5 tablet, Rfl: 0   ibuprofen (ADVIL) 200 MG tablet, Take 200 mg by mouth every 6 (six) hours as needed., Disp: , Rfl:    sertraline (ZOLOFT) 25 MG tablet, Take 25 mg by mouth daily., Disp: , Rfl:    tretinoin  (RETIN-A ) 0.025 % cream, Apply topically at bedtime. Apply pea size amount to face every other night and increase as tolerated., Disp: 45 g, Rfl: 6   ZEPBOUND 5 MG/0.5ML Pen, Inject 0.5 mLs into the skin once a week., Disp: ,  Rfl:   "

## 2024-10-21 NOTE — Progress Notes (Cosign Needed)
 "    Patient ID: Tracie Newman, female    DOB: 1990/11/29, 33 y.o.   MRN: [id number]  Chief Complaint  Patient presents with   Pre-op Exam      ICD-10-CM   1. Earlobe lesion, left  H61.92        History of Present Illness: Tracie Newman is a 33 y.o.  female  with a history of left earlobe mass.  She presents for preoperative evaluation for upcoming procedure, excision of left earlobe mass, scheduled for 10/28/2024 with Dr. Montorfano.  The patient states that she has never had general anesthesia before.  Patient reports that she had an endoscopy and did okay with the anesthesia then.  She denies any history of cardiac disease.  She denies taking any blood thinners.  Patient reports she is not a smoker.  Patient denies taking any birth control or hormone replacement.  She denies any history of greater than 3 miscarriages.  She denies any personal or family history of blood clots or clotting diseases.  She denies any recent surgeries, traumas or infections.  She denies any history of stroke or heart attack.  She denies any history of Crohn's disease or ulcerative colitis.  She denies any history of cancer.  She denies any varicosities to her lower extremities.  She denies any recent fevers, chills or changes in her health.  Patient was inquiring if this could be done under twilight sedation.  I discussed with her that I would have to check with Dr. Montorfano and with the anesthesia team to see if this would be possible.  I did discuss with her though that she may need general anesthesia and may need to be intubated for the general anesthesia.  Also encouraged her to discuss this with the anesthesia team the day of surgery.  She expressed understanding.  Summary of Previous Visit: Patient was seen for initial consult by Dr. Montorfano on 06/17/2024.  At this visit, patient reported that she had a recurrent left earlobe mass.  She stated that it was present for about a year and had history of a  piercing to the area.  A piece of metal was left and patient formed a granuloma that was removed, but the mass reoccurred.  On exam it was noted that the left earlobe had a 0.5 x 0.5 cm raised, shiny, firm mass with edges well-defined is not spreading beyond the original wound.  The plan was to move forward with excision of recurrent earlobe mass in the OR due to reoccurrence.  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  Social History: Social History   Socioeconomic History   Marital status: Single    Spouse name: Not on file   Number of children: Not on file   Years of education: Not on file   Highest education level: Not on file  Occupational History   Occupation: Attorney    Comment: [employer]  Tobacco Use   Smoking status: Never   Smokeless tobacco:  Never  Vaping Use   Vaping status: Never Used  Substance and Sexual Activity   Alcohol use: Yes    Comment: occassionally   Drug use: Never   Sexual activity: Yes    Comment: sometimes uses condoms and birth control  Other Topics Concern   Not on file  Social History Narrative   Not on file   Social Drivers of Health   Tobacco Use: Low Risk (10/20/2024)   Patient History    Smoking Tobacco Use: Never    Smokeless Tobacco Use: Never    Passive Exposure: Not on file  Financial Resource Strain: Not on file  Food Insecurity: Not on file  Transportation Needs: Not on file  Physical Activity: Not on file  Stress: Not on file  Social Connections: Not on file  Intimate  Partner Violence: Low Risk (04/08/2023)   Received from [facility]   Interpersonal Safety    Feels UN-safe at Home or Work/School: no    Feels Unsafe: Not on file    Physical Abuse Present: Not on file  Depression (PHQ2-9): Not on file  Alcohol Screen: Not on file  Housing: Not on file  Utilities: Not on file  Health Literacy: Not on file    Family History: Family History  Problem Relation Age of Onset   Obesity Mother    Heart disease Father    Sleep apnea Father    Obesity Father     Review of Systems: Denies any recent fevers, chills or changes in her health  Physical Exam: Vital Signs BP 122/80   Pulse 78   Temp 98.4 F (36.9 C)   Ht 5' 9 (1.753 m)   Wt 256 lb 3.2 oz (116.2 kg)   SpO2 98%   BMI 37.83 kg/m   Physical Exam  Constitutional:      General: Not in acute distress.    Appearance: Normal appearance. Not ill-appearing.  HENT:     Head: Normocephalic and atraumatic.  Mass to the left ear noted. Eyes:     Pupils: Pupils are equal, round Neck:     Musculoskeletal: Normal range of motion.  Cardiovascular:     Rate and Rhythm: Normal rate Pulmonary:     Effort: Pulmonary effort is normal. No respiratory distress.  Musculoskeletal: Normal range of motion.  Skin:    Findings: No erythema or rash.  Neurological:     Mental Status: Alert and oriented to person, place, and time. Mental status is at baseline.  Psychiatric:        Mood and Affect: Mood normal.        Behavior: Behavior normal.    Assessment/Plan: The patient is scheduled for excision of left earlobe mass with Dr. Montorfano.  Risks, benefits, and alternatives of procedure discussed, questions answered and consent obtained.    Smoking Status: Non-smoker; Counseling Given?  N/A  Caprini Score: 3; Risk Factors include: BMI > 25, and length of planned surgery. Recommendation for mechanical prophylaxis. Encourage early ambulation.   Pictures obtained: @consult   Post-op  Rx sent to pharmacy: Oxycodone , Zofran   Instructed the patient to hold her Zepbound at least 1 week prior to surgery.  Discussed with her to hold any multivitamins or supplements at least 1 week prior to surgery.  She expressed understanding.  Patient was provided with the General Surgical Risk consent document and Pain Medication Agreement prior to their appointment.  They had adequate time to read through the risk consent documents and Pain Medication  Agreement. We also discussed them in person together during this preop appointment. All of their questions were answered to their satisfaction.  Recommended calling if they have any further questions.  Risk consent form and Pain Medication Agreement to be scanned into patient's chart.  The consent was obtained with risks and complications reviewed which included bleeding, pain, scar, infection and the risk of anesthesia.  The patients questions were answered to the patients expressed satisfaction.    Electronically signed by: Estefana FORBES Peck, PA-C 10/21/2024 12:53 PM      [1]  Allergies Allergen Reactions   Bupropion Nausea Only  [2]  Current Outpatient Medications:    ondansetron (ZOFRAN) 4 MG tablet, Take 1 tablet (4 mg total) by mouth every 8 (eight) hours as needed for up to 10 doses for nausea or vomiting., Disp: 10 tablet, Rfl: 0   oxyCODONE (ROXICODONE) 5 MG immediate release tablet, Take 1 tablet (5 mg total) by mouth every 6 (six) hours as needed for up to 5 doses for severe pain (pain score 7-10)., Disp: 5 tablet, Rfl: 0   ibuprofen (ADVIL) 200 MG tablet, Take 200 mg by mouth every 6 (six) hours as needed., Disp: , Rfl:    sertraline (ZOLOFT) 25 MG tablet, Take 25 mg by mouth daily., Disp: , Rfl:    tretinoin  (RETIN-A ) 0.025 % cream, Apply topically at bedtime. Apply pea size amount to face every other night and increase as tolerated., Disp: 45 g, Rfl: 6   ZEPBOUND 5 MG/0.5ML Pen, Inject 0.5 mLs into the skin once a week., Disp: ,  Rfl:   "

## 2024-10-28 ENCOUNTER — Ambulatory Visit (HOSPITAL_BASED_OUTPATIENT_CLINIC_OR_DEPARTMENT_OTHER): Admitting: Anesthesiology

## 2024-10-28 ENCOUNTER — Ambulatory Visit (HOSPITAL_BASED_OUTPATIENT_CLINIC_OR_DEPARTMENT_OTHER): Admission: RE | Admit: 2024-10-28 | Discharge: 2024-10-28 | Disposition: A | Discharge status: Home / Self Care

## 2024-10-28 ENCOUNTER — Encounter (HOSPITAL_BASED_OUTPATIENT_CLINIC_OR_DEPARTMENT_OTHER): Admission: RE | Disposition: A | Discharge status: Home / Self Care | Payer: Self-pay | Source: Home / Self Care

## 2024-10-28 ENCOUNTER — Other Ambulatory Visit: Payer: Self-pay

## 2024-10-28 ENCOUNTER — Encounter (HOSPITAL_BASED_OUTPATIENT_CLINIC_OR_DEPARTMENT_OTHER): Payer: Self-pay

## 2024-10-28 DIAGNOSIS — H6192 Disorder of left external ear, unspecified: Secondary | ICD-10-CM | POA: Insufficient documentation

## 2024-10-28 DIAGNOSIS — F411 Generalized anxiety disorder: Secondary | ICD-10-CM | POA: Diagnosis not present

## 2024-10-28 DIAGNOSIS — E66813 Obesity, class 3: Secondary | ICD-10-CM | POA: Diagnosis not present

## 2024-10-28 DIAGNOSIS — K219 Gastro-esophageal reflux disease without esophagitis: Secondary | ICD-10-CM | POA: Insufficient documentation

## 2024-10-28 DIAGNOSIS — H61892 Other specified disorders of left external ear: Secondary | ICD-10-CM

## 2024-10-28 DIAGNOSIS — Z6838 Body mass index (BMI) 38.0-38.9, adult: Secondary | ICD-10-CM | POA: Insufficient documentation

## 2024-10-28 DIAGNOSIS — H61899 Other specified disorders of external ear, unspecified ear: Secondary | ICD-10-CM | POA: Diagnosis not present

## 2024-10-28 HISTORY — PX: EXCISION MASS HEAD: SHX6702

## 2024-10-28 LAB — POCT PREGNANCY, URINE: Preg Test, Ur: NEGATIVE

## 2024-10-28 SURGERY — EXCISION, MASS, HEAD
Anesthesia: General | Site: Ear | Laterality: Left

## 2024-10-28 MED ORDER — LACTATED RINGERS IV SOLN: INTRAVENOUS | Status: DC

## 2024-10-28 MED ORDER — DEXAMETHASONE SOD PHOSPHATE PF 10 MG/ML IJ SOLN
INTRAMUSCULAR | Status: DC | PRN
  Administered 2024-10-28: 10 mg via INTRAVENOUS

## 2024-10-28 MED ORDER — PROPOFOL 500 MG/50ML IV EMUL
INTRAVENOUS | Status: AC
  Filled 2024-10-28: qty 150

## 2024-10-28 MED ORDER — BUPIVACAINE HCL (PF) 0.25 % IJ SOLN
INTRAMUSCULAR | Status: DC | PRN
  Administered 2024-10-28: 5 mL

## 2024-10-28 MED ORDER — GABAPENTIN 300 MG PO CAPS
300.0000 mg | ORAL_CAPSULE | ORAL | Status: AC
  Administered 2024-10-28: 300 mg via ORAL

## 2024-10-28 MED ORDER — KETOROLAC TROMETHAMINE 30 MG/ML IJ SOLN
INTRAMUSCULAR | Status: AC
  Filled 2024-10-28: qty 1

## 2024-10-28 MED ORDER — BACITRACIN 500 UNIT/GM EX OINT
TOPICAL_OINTMENT | CUTANEOUS | Status: DC | PRN
  Administered 2024-10-28: 1 via TOPICAL

## 2024-10-28 MED ORDER — MIDAZOLAM HCL (PF) 2 MG/2ML IJ SOLN
INTRAMUSCULAR | Status: DC | PRN
  Administered 2024-10-28: 2 mg via INTRAVENOUS

## 2024-10-28 MED ORDER — ACETAMINOPHEN 500 MG PO TABS
1000.0000 mg | ORAL_TABLET | ORAL | Status: AC
  Administered 2024-10-28: 1000 mg via ORAL

## 2024-10-28 MED ORDER — LIDOCAINE 2% (20 MG/ML) 5 ML SYRINGE
INTRAMUSCULAR | Status: AC
  Filled 2024-10-28: qty 5

## 2024-10-28 MED ORDER — ROCURONIUM BROMIDE 10 MG/ML (PF) SYRINGE
PREFILLED_SYRINGE | INTRAVENOUS | Status: AC
  Filled 2024-10-28: qty 10

## 2024-10-28 MED ORDER — CHLORHEXIDINE GLUCONATE CLOTH 2 % EX PADS: 6.0000 | MEDICATED_PAD | Freq: Once | CUTANEOUS | Status: DC

## 2024-10-28 MED ORDER — CEFAZOLIN SODIUM-DEXTROSE 2-4 GM/100ML-% IV SOLN
INTRAVENOUS | Status: AC
  Filled 2024-10-28: qty 100

## 2024-10-28 MED ORDER — ACETAMINOPHEN 500 MG PO TABS
ORAL_TABLET | ORAL | Status: AC
  Filled 2024-10-28: qty 2

## 2024-10-28 MED ORDER — FENTANYL CITRATE (PF) 100 MCG/2ML IJ SOLN
INTRAMUSCULAR | Status: DC | PRN
  Administered 2024-10-28 (×2): 50 ug via INTRAVENOUS

## 2024-10-28 MED ORDER — CEFAZOLIN SODIUM-DEXTROSE 2-4 GM/100ML-% IV SOLN
2.0000 g | INTRAVENOUS | Status: AC
  Administered 2024-10-28: 2 g via INTRAVENOUS

## 2024-10-28 MED ORDER — LIDOCAINE-EPINEPHRINE 1 %-1:100000 IJ SOLN
INTRAMUSCULAR | Status: DC | PRN
  Administered 2024-10-28: 2 mL

## 2024-10-28 MED ORDER — ONDANSETRON HCL 4 MG/2ML IJ SOLN
INTRAMUSCULAR | Status: DC | PRN
  Administered 2024-10-28: 4 mg via INTRAVENOUS

## 2024-10-28 MED ORDER — LIDOCAINE 2% (20 MG/ML) 5 ML SYRINGE
INTRAMUSCULAR | Status: DC | PRN
  Administered 2024-10-28: 60 mg via INTRAVENOUS

## 2024-10-28 MED ORDER — GABAPENTIN 300 MG PO CAPS
ORAL_CAPSULE | ORAL | Status: AC
  Filled 2024-10-28: qty 1

## 2024-10-28 MED ORDER — MIDAZOLAM HCL 2 MG/2ML IJ SOLN
INTRAMUSCULAR | Status: AC
  Filled 2024-10-28: qty 2

## 2024-10-28 MED ORDER — ONDANSETRON HCL 4 MG/2ML IJ SOLN
INTRAMUSCULAR | Status: AC
  Filled 2024-10-28: qty 2

## 2024-10-28 MED ORDER — FENTANYL CITRATE (PF) 100 MCG/2ML IJ SOLN
INTRAMUSCULAR | Status: AC
  Filled 2024-10-28: qty 2

## 2024-10-28 MED ORDER — FENTANYL CITRATE (PF) 100 MCG/2ML IJ SOLN: 25.0000 ug | INTRAMUSCULAR | Status: DC | PRN

## 2024-10-28 MED ORDER — PROPOFOL 10 MG/ML IV BOLUS
INTRAVENOUS | Status: DC | PRN
  Administered 2024-10-28: 200 mg via INTRAVENOUS
  Administered 2024-10-28: 200 ug/kg/min via INTRAVENOUS

## 2024-10-28 MED ORDER — PHENYLEPHRINE 80 MCG/ML (10ML) SYRINGE FOR IV PUSH (FOR BLOOD PRESSURE SUPPORT)
PREFILLED_SYRINGE | INTRAVENOUS | Status: DC | PRN
  Administered 2024-10-28 (×3): 80 ug via INTRAVENOUS

## 2024-10-28 MED ORDER — 0.9 % SODIUM CHLORIDE (POUR BTL) OPTIME
TOPICAL | Status: DC | PRN
  Administered 2024-10-28: 100 mL

## 2024-10-28 SURGICAL SUPPLY — 70 items
BLADE CLIPPER SURG (BLADE) IMPLANT
BLADE MINI RND TIP GREEN BEAV (BLADE) IMPLANT
BLADE SURG 10 STRL SS (BLADE) IMPLANT
BLADE SURG 15 STRL LF DISP TIS (BLADE) ×1 IMPLANT
BNDG GAUZE DERMACEA FLUFF 4 (GAUZE/BANDAGES/DRESSINGS) ×1 IMPLANT
CANISTER SUCT 1200ML W/VALVE (MISCELLANEOUS) IMPLANT
CLEANSER WND VASHE 34 (WOUND CARE) ×1 IMPLANT
CORD BIPOLAR FORCEPS 12FT (ELECTRODE) IMPLANT
COVER BACK TABLE 60X90IN (DRAPES) IMPLANT
COVER MAYO STAND STRL (DRAPES) IMPLANT
DERMABOND ADVANCED .7 DNX12 (GAUZE/BANDAGES/DRESSINGS) IMPLANT
DRAPE U-SHAPE 76X120 STRL (DRAPES) IMPLANT
DRAPE UTILITY XL STRL (DRAPES) ×1 IMPLANT
DRESSING MEPILEX FLEX 4X4 (GAUZE/BANDAGES/DRESSINGS) IMPLANT
DRSG ADAPTIC 3X8 NADH LF (GAUZE/BANDAGES/DRESSINGS) IMPLANT
DRSG EMULSION OIL 3X3 NADH (GAUZE/BANDAGES/DRESSINGS) IMPLANT
ELECT COATED BLADE 2.86 ST (ELECTRODE) IMPLANT
ELECT NDL BLADE 2-5/6 (NEEDLE) IMPLANT
ELECT NEEDLE BLADE 2-5/6 (NEEDLE) IMPLANT
ELECTRODE REM PT RETRN 9FT PED (ELECTROSURGICAL) IMPLANT
ELECTRODE REM PT RTRN 9FT ADLT (ELECTROSURGICAL) IMPLANT
GAUZE PAD ABD 8X10 STRL (GAUZE/BANDAGES/DRESSINGS) IMPLANT
GAUZE SPONGE 4X4 12PLY STRL LF (GAUZE/BANDAGES/DRESSINGS) IMPLANT
GAUZE XEROFORM 1X8 LF (GAUZE/BANDAGES/DRESSINGS) IMPLANT
GAUZE XEROFORM 5X9 LF (GAUZE/BANDAGES/DRESSINGS) IMPLANT
GLOVE BIO SURGEON STRL SZ 6.5 (GLOVE) IMPLANT
GLOVE BIO SURGEON STRL SZ7.5 (GLOVE) IMPLANT
GLOVE BIO SURGEON STRL SZ8 (GLOVE) ×1 IMPLANT
GLOVE BIOGEL PI IND STRL 7.0 (GLOVE) IMPLANT
GLOVE BIOGEL PI IND STRL 8 (GLOVE) ×1 IMPLANT
GOWN STRL REUS W/ TWL LRG LVL3 (GOWN DISPOSABLE) IMPLANT
GOWN STRL REUS W/ TWL XL LVL3 (GOWN DISPOSABLE) IMPLANT
GOWN STRL REUS W/TWL XL LVL3 (GOWN DISPOSABLE) ×1 IMPLANT
HYDROGEN PEROXIDE 16OZ (MISCELLANEOUS) ×1 IMPLANT
NDL HYPO 25GX1X1/2 BEV (NEEDLE) ×1 IMPLANT
NDL HYPO 30GX1 BEV (NEEDLE) IMPLANT
NDL PRECISIONGLIDE 27X1.5 (NEEDLE) IMPLANT
NEEDLE HYPO 25GX1X1/2 BEV (NEEDLE) ×1 IMPLANT
NEEDLE HYPO 30GX1 BEV (NEEDLE) IMPLANT
NEEDLE PRECISIONGLIDE 27X1.5 (NEEDLE) IMPLANT
PACK BASIN DAY SURGERY FS (CUSTOM PROCEDURE TRAY) ×1 IMPLANT
PACK UNIVERSAL I (CUSTOM PROCEDURE TRAY) ×1 IMPLANT
PENCIL SMOKE EVACUATOR (MISCELLANEOUS) IMPLANT
SHEET MEDIUM DRAPE 40X70 STRL (DRAPES) IMPLANT
SLEEVE SCD COMPRESS KNEE MED (STOCKING) IMPLANT
SOL PREP POV-IOD 4OZ 10% (MISCELLANEOUS) ×1 IMPLANT
SOLN 0.9% NACL POUR BTL 1000ML (IV SOLUTION) IMPLANT
SPONGE T-LAP 18X18 ~~LOC~~+RFID (SPONGE) ×1 IMPLANT
STAPLER SKIN PROX WIDE 3.9 (STAPLE) ×1 IMPLANT
STOCKINETTE 6 STRL (DRAPES) ×1 IMPLANT
STRIP CLOSURE SKIN 1/2X4 (GAUZE/BANDAGES/DRESSINGS) IMPLANT
STRIP SUTURE WOUND CLOSURE 1/2 (MISCELLANEOUS) IMPLANT
SUCTION TUBE FRAZIER 10FR DISP (SUCTIONS) IMPLANT
SUT ETHILON 3 0 PS 1 (SUTURE) IMPLANT
SUT ETHILON 4 0 PS 2 18 (SUTURE) IMPLANT
SUT MNCRL AB 4-0 PS2 18 (SUTURE) IMPLANT
SUT PDS AB 2-0 CT2 27 (SUTURE) IMPLANT
SUT PDS AB 3-0 CT2 27 (SUTURE) IMPLANT
SUT PDS II 3-0 CT2 27 ABS (SUTURE) IMPLANT
SUT SILK 2 0 SH (SUTURE) IMPLANT
SUT VIC AB 0 CT1 18XCR BRD 8 (SUTURE) IMPLANT
SUT VIC AB 0 CT1 27XBRD ANBCTR (SUTURE) IMPLANT
SUT VIC AB 3-0 SH 27X BRD (SUTURE) IMPLANT
SUT VIC AB 4-0 PS2 18 (SUTURE) IMPLANT
SUT VICRYL RAPIDE 4-0 (SUTURE) IMPLANT
SYR BULB EAR ULCER 3OZ GRN STR (SYRINGE) ×2 IMPLANT
SYR CONTROL 10ML LL (SYRINGE) ×1 IMPLANT
TOWEL GREEN STERILE FF (TOWEL DISPOSABLE) ×1 IMPLANT
TRAY DSU PREP LF (CUSTOM PROCEDURE TRAY) IMPLANT
TUBE CONNECTING 20X1/4 (TUBING) ×1 IMPLANT

## 2024-10-28 NOTE — Anesthesia Procedure Notes (Signed)
 Procedure Name: LMA Insertion Date/Time: 10/28/2024 7:34 AM  Performed by: Lenard Lacks, CRNAPre-anesthesia Checklist: Patient identified, Emergency Drugs available, Suction available and Patient being monitored Patient Re-evaluated:Patient Re-evaluated prior to induction Oxygen Delivery Method: Circle system utilized Preoxygenation: Pre-oxygenation with 100% oxygen Induction Type: IV induction LMA: LMA inserted LMA Size: 4.0 Number of attempts: 1 Placement Confirmation: positive ETCO2 and breath sounds checked- equal and bilateral Dental Injury: Teeth and Oropharynx as per pre-operative assessment

## 2024-10-28 NOTE — Discharge Instructions (Addendum)
 Activity (include date of return to work if known) As tolerated: NO showers for 48 hours NO driving No heavy activities  Diet:regular No restrictions  Wound Care: Keep dressing clean & dry Bacitracin  to wound for 7 days.  Special Instructions: No piercing for 6 months Call Doctor if any unusual problems occur such as pain, excessive Bleeding, unrelieved Nausea/vomiting, Fever &/or chills When lying down, keep head elevated on 2-3 pillows or back-rest Follow-up appointment: Please call the office.  The patient received discharge instruction from:___________________________________________   Patient signature ________________________________________ / Date___________    Signature of individual providing instructions/ Date________________               Post Anesthesia Home Care Instructions  Activity: Get plenty of rest for the remainder of the day. A responsible individual must stay with you for 24 hours following the procedure.  For the next 24 hours, DO NOT: -Drive a car -Advertising copywriter -Drink alcoholic beverages -Take any medication unless instructed by your physician -Make any legal decisions or sign important papers.  Meals: Start with liquid foods such as gelatin or soup. Progress to regular foods as tolerated. Avoid greasy, spicy, heavy foods. If nausea and/or vomiting occur, drink only clear liquids until the nausea and/or vomiting subsides. Call your physician if vomiting continues.  Special Instructions/Symptoms: Your throat may feel dry or sore from the anesthesia or the breathing tube placed in your throat during surgery. If this causes discomfort, gargle with warm salt water. The discomfort should disappear within 24 hours.  May have Tylenol  after 12:45pm.

## 2024-10-28 NOTE — Anesthesia Preprocedure Evaluation (Addendum)
"                                    Anesthesia Evaluation  Patient identified by MRN, date of birth, ID band Patient awake    Reviewed: Allergy & Precautions, H&P , NPO status , Patient's Chart, lab work & pertinent test results  Airway Mallampati: II  TM Distance: >3 FB     Dental no notable dental hx. (+) Teeth Intact, Dental Advisory Given   Pulmonary neg pulmonary ROS   Pulmonary exam normal breath sounds clear to auscultation       Cardiovascular negative cardio ROS  Rhythm:Regular Rate:Normal     Neuro/Psych   Anxiety     negative neurological ROS     GI/Hepatic Neg liver ROS,GERD  ,,  Endo/Other    Class 3 obesity  Renal/GU negative Renal ROS  negative genitourinary   Musculoskeletal   Abdominal   Peds  Hematology negative hematology ROS (+)   Anesthesia Other Findings   Reproductive/Obstetrics negative OB ROS                              Anesthesia Physical Anesthesia Plan  ASA: 2  Anesthesia Plan: General   Post-op Pain Management: Tylenol  PO (pre-op)*   Induction: Intravenous  PONV Risk Score and Plan: 4 or greater and Ondansetron , Dexamethasone , Midazolam , Propofol  infusion and TIVA  Airway Management Planned: LMA  Additional Equipment:   Intra-op Plan:   Post-operative Plan: Extubation in OR  Informed Consent: I have reviewed the patients History and Physical, chart, labs and discussed the procedure including the risks, benefits and alternatives for the proposed anesthesia with the patient or authorized representative who has indicated his/her understanding and acceptance.     Dental advisory given  Plan Discussed with: CRNA  Anesthesia Plan Comments:          Anesthesia Quick Evaluation  "

## 2024-10-28 NOTE — Op Note (Addendum)
 Operative Note   DATE OF OPERATION: 10/28/2024  LOCATION: [facility]  SURGICAL DEPARTMENT: Plastic Surgery  PREOPERATIVE DIAGNOSES:  Recurrent left earlobe mass  POSTOPERATIVE DIAGNOSES:  Left Earlobe Fistula  PROCEDURE:  Excision of left earlobe fistula  SURGEON: Suleima Ohlendorf, MD  ASSISTANT: None  ANESTHESIA:  General.   COMPLICATIONS: None.   EBL < 5 cc  INDICATIONS FOR PROCEDURE:  The patient, Tracie Newman is a 33 y.o. female born on November 10, 1990, is here for treatment of left earlobe mass, recurrent. MRN: [id number]  CONSENT:  Informed consent was obtained directly from the patient. Risks, benefits and alternatives were fully discussed. Specific risks including but not limited to bleeding, infection, hematoma, seroma, scarring, pain, infection, contracture, asymmetry, wound healing problems, and need for further surgery were all discussed. The patient did have an ample opportunity to have questions answered to satisfaction.   DESCRIPTION OF PROCEDURE:  The patient was taken to the operating room. SCDs were placed and IV antibiotics were given. The patient's operative site was prepped and draped in a sterile fashion. A time out was performed and all information was confirmed to be correct. General anesthesia was administered.     5 cc of local anesthesia was injected (1% lidocaine  with epinephrine  mixed with marcaine  0.25%) to perform a lower ear block. The incision was then made on top of the left posterior earlobe mass. The mass was meticulously circumferentially dissected down to subcutaneous tissue, and it should be noted that it had fistula appearance. The size of the mass was 0.6 cm x 0.2 cm. The fistula was removed and sent as a pathological specimen. The wound was washed with saline twice. Meticulous hemostasis was achieved. The residual wound was closed in layers using 4-0 Monocryl deep stitches, and 5-0 Nylon for skin. The incision was dressed with bacitracin  and Opsite.     The patient tolerated the procedure well.  There were no complications. Counts were correct. The patient was allowed to wake from anesthesia, extubated and taken to the recovery room in satisfactory condition.    Tracie Sandora M. Kelcee Bjorn, MD [facility]

## 2024-10-28 NOTE — Interval H&P Note (Signed)
 History and Physical Interval Note:  10/28/2024 6:53 AM  Tracie Newman Public  has presented today for surgery, with the diagnosis of Lesion of external ear, left.  The various methods of treatment have been discussed with the patient and family. After consideration of risks, benefits and other options for treatment, the patient has consented to  Procedures with comments: EXCISION, MASS, HEAD (Left) - Earlobe mass excision as a surgical intervention.  The patient's history has been reviewed, patient examined, no change in status, stable for surgery.  I have reviewed the patient's chart and labs.  Questions were answered to the patient's satisfaction.     [name] M [name]

## 2024-10-28 NOTE — Anesthesia Postprocedure Evaluation (Signed)
"   Anesthesia Post Note  Patient: Tracie Newman  Procedure(s) Performed: EXCISION, MASS, HEAD (Left: Ear)     Patient location during evaluation: PACU Anesthesia Type: General Level of consciousness: awake and alert Pain management: pain level controlled Vital Signs Assessment: post-procedure vital signs reviewed and stable Respiratory status: spontaneous breathing, nonlabored ventilation and respiratory function stable Cardiovascular status: blood pressure returned to baseline and stable Postop Assessment: no apparent nausea or vomiting Anesthetic complications: no   No notable events documented.  Last Vitals:  Vitals:   10/28/24 0845 10/28/24 0858  BP: 101/66 (!) 111/56  Pulse: 82 81  Resp: 17 14  Temp:  37 C  SpO2: 94% 95%    Last Pain:  Vitals:   10/28/24 0858  TempSrc: Temporal  PainSc: 0-No pain                 [name],W. EDMOND      "

## 2024-10-28 NOTE — Transfer of Care (Signed)
 Immediate Anesthesia Transfer of Care Note  Patient: Tracie Newman  Procedure(s) Performed: EXCISION, MASS, HEAD (Left: Ear)  Patient Location: PACU  Anesthesia Type:General  Level of Consciousness: drowsy  Airway & Oxygen Therapy: Patient Spontanous Breathing  Post-op Assessment: Report given to RN and Post -op Vital signs reviewed and stable  Post vital signs: Reviewed and stable  Last Vitals:  Vitals Value Taken Time  BP 101/56 10/28/24 08:27  Temp    Pulse 95 10/28/24 08:29  Resp 21 10/28/24 08:29  SpO2 94 % 10/28/24 08:29  Vitals shown include unfiled device data.  Last Pain:  Vitals:   10/28/24 0640  TempSrc: Temporal  PainSc: 0-No pain         Complications: No notable events documented.

## 2024-10-29 ENCOUNTER — Encounter (HOSPITAL_BASED_OUTPATIENT_CLINIC_OR_DEPARTMENT_OTHER): Payer: Self-pay

## 2024-11-03 LAB — SURGICAL PATHOLOGY

## 2024-11-04 ENCOUNTER — Encounter

## 2024-11-09 ENCOUNTER — Ambulatory Visit

## 2024-11-09 DIAGNOSIS — H6192 Disorder of left external ear, unspecified: Secondary | ICD-10-CM

## 2024-11-09 DIAGNOSIS — Z09 Encounter for follow-up examination after completed treatment for conditions other than malignant neoplasm: Secondary | ICD-10-CM

## 2024-11-09 NOTE — Progress Notes (Signed)
" ° °  Established Patient Office Visit  Subjective   Patient ID: Tracie Newman, female    DOB: [birth year]  Age: 34 y.o. MRN: [id number]  No chief complaint on file.   HPI  34 y.o. female born on 04/12/1991, is here for follow-up after excision of left earlobe fistula on 10/28/2024, recurrent.  Has been doing well.  No signs of infection.  Small induration noticed in the anterior ear lobe.  Posterior earlobe has healed appropriately.   Objective:     There were no vitals taken for this visit.   Physical Exam MA as chaperone Alert oriented x 3 Left earlobe incision clean dry intact.  Stitches in place.  No signs of infection.  Small induration noticed in the anterior earlobe.   Surgical pathology   FISTULA, LEFT EAR:  - Benign skin with chronic inflammation.   Assessment & Plan:   Problem List Items Addressed This Visit   None Visit Diagnoses       Earlobe lesion, left    -  Primary     Follow-up exam          Anterior earlobe induration was resected under sterile technique in the office after obtaining verbal consent from the patient and a few nonabsorbable stitches were placed in the earlobe.  This stitches need to be removed in 10 days.  Okay to shower in 2 days.  Posterior earlobe stitches removed.  Pathology report discussed with the patient.  Patient was allowed to ask questions.  All questions answered to patient satisfaction.  Patient will follow-up with me in 10 days for removal of stitches.  Gedalia Mcmillon M. Dari Carpenito, MD [facility]    "

## 2024-11-16 ENCOUNTER — Ambulatory Visit (INDEPENDENT_AMBULATORY_CARE_PROVIDER_SITE_OTHER)

## 2024-11-16 VITALS — BP 118/78 | HR 88 | Ht 69.0 in | Wt 255.4 lb

## 2024-11-16 DIAGNOSIS — H6192 Disorder of left external ear, unspecified: Secondary | ICD-10-CM

## 2024-11-16 DIAGNOSIS — Z09 Encounter for follow-up examination after completed treatment for conditions other than malignant neoplasm: Secondary | ICD-10-CM

## 2024-11-16 NOTE — Progress Notes (Signed)
" ° °  Established Patient Office Visit  Subjective   Patient ID: Tracie Newman, female    DOB: Oct 20, 1991  Age: 34 y.o. MRN: [id number]  Chief Complaint  Patient presents with   Post-op Follow-up    HPI 34 y.o. female born on 22-Sep-1991, is here for follow-up after excision of left earlobe fistula on 10/28/2024, recurrent.  Has been doing well.  No signs of infection. Follows up for removal of stitches.     Objective:     BP 118/78 (BP Location: Left Arm, Patient Position: Sitting, Cuff Size: Large)   Pulse 88   Ht 5' 9 (1.753 m)   Wt 255 lb 6.4 oz (115.8 kg)   SpO2 98%   BMI 37.72 kg/m  BP Readings from Last 3 Encounters:  11/16/24 118/78  10/28/24 (!) 111/56  10/21/24 122/80    Physical Exam MA as chaperone Left earlobe incision clean dry intact. Stitches in place. No signs of infection.    Assessment & Plan:   Problem List Items Addressed This Visit   None Visit Diagnoses       Earlobe lesion, left    -  Primary     Follow-up exam          Removal of stitches today. Start massaging earlobe in 7 days. Patient will follow-up with PA in 1 month and with me in 3 months. All questions answered to patient satisfaction.   Yanessa Hocevar M Mykaela Arena, MD  "

## 2024-12-02 ENCOUNTER — Encounter: Payer: Self-pay | Admitting: Emergency Medicine

## 2024-12-02 ENCOUNTER — Ambulatory Visit: Admission: EM | Admit: 2024-12-02 | Discharge: 2024-12-02 | Disposition: A | Discharge status: Home / Self Care

## 2024-12-02 DIAGNOSIS — B3731 Acute candidiasis of vulva and vagina: Secondary | ICD-10-CM

## 2024-12-02 MED ORDER — FLUCONAZOLE 150 MG PO TABS: 150.0000 mg | ORAL_TABLET | ORAL | 0 refills | Status: AC

## 2024-12-02 NOTE — ED Provider Notes (Signed)
 " [facility]    CSN: [id number] Arrival date & time: 12/02/24  1523      History   Chief Complaint Chief Complaint  Patient presents with   Vaginal Discharge    HPI Tracie Newman is a 34 y.o. female.   The patient presented with a chief complaint of a yeast infection, experiencing symptoms that began at the start of the week. The symptoms include irritation and a slightly white discharge, which became more apparent today. The patient reports itching both inside and outside, although the outside itching is not as severe as previous infections. There is no associated odor with the discharge, nor are there symptoms of belly pain, nausea, vomiting, or diarrhea. The patient denies any concern for sexually transmitted diseases.  She has had yeast infections before and reports that the symptoms are the same.  She did receive IV Ancef  during a surgical procedure at the beginning of this month.  She denies other antibiotic use.  The history is provided by the patient.  Vaginal Discharge Associated symptoms: no abdominal pain, no dysuria, no nausea and no vomiting     Past Medical History:  Diagnosis Date   Anxiety    B12 deficiency    Back pain    GERD (gastroesophageal reflux disease)    History of stomach ulcers    Vitamin D  deficiency     Patient Active Problem List   Diagnosis Date Noted   GAD (generalized anxiety disorder) 11/28/2020   At risk for malnutrition 11/28/2020   At risk for dehydration 10/02/2020   At risk for impaired metabolic function 09/13/2020   Insulin  resistance 08/30/2020   At risk for diabetes mellitus 08/30/2020   Class 3 severe obesity with serious comorbidity and body mass index (BMI) of 40.0 to 44.9 in adult (HCC) 08/30/2020   Mood disorder (HCC) - Emotional eating 08/16/2020   Gastroesophageal reflux disease 08/16/2020   B12 deficiency 08/16/2020   Vitamin D  deficiency 08/16/2020   H/O gastric ulcer 08/16/2020   BMI 40.0-44.9, adult  (HCC) 08/16/2020    Past Surgical History:  Procedure Laterality Date   EXCISION MASS HEAD Left 10/28/2024   Procedure: EXCISION, MASS, HEAD;  Surgeon: Montorfano, Lisandro M, MD;  Location: [facility];  Service: Plastics;  Laterality: Left;  Earlobe mass excision    OB History     Gravida  0   Para  0   Term  0   Preterm  0   AB  0   Living  0      SAB  0   IAB  0   Ectopic  0   Multiple  0   Live Births  0            Home Medications    Prior to Admission medications  Medication Sig Start Date End Date Taking? Authorizing Provider  fluconazole  (DIFLUCAN ) 150 MG tablet Take 1 tablet (150 mg total) by mouth every 3 (three) days. 12/02/24  Yes Leatrice Vernell HERO, NP  ibuprofen (ADVIL) 200 MG tablet Take 200 mg by mouth every 6 (six) hours as needed.    [provider]  ondansetron  (ZOFRAN ) 4 MG tablet Take 1 tablet (4 mg total) by mouth every 8 (eight) hours as needed for up to 10 doses for nausea or vomiting. 10/21/24   Andris Estefana BRAVO, PA-C  oxyCODONE  (ROXICODONE ) 5 MG immediate release tablet Take 1 tablet (5 mg total) by mouth every 6 (six) hours as needed  for up to 5 doses for severe pain (pain score 7-10). 10/21/24   Andris Estefana BRAVO, PA-C  sertraline (ZOLOFT) 25 MG tablet Take 25 mg by mouth daily. 01/04/24   [provider]  tretinoin  (RETIN-A ) 0.025 % cream Apply topically at bedtime. Apply pea size amount to face every other night and increase as tolerated. 09/13/24 09/13/25  Sandridge, Brenda K, PA-C  ZEPBOUND 5 MG/0.5ML Pen Inject 0.5 mLs into the skin once a week. 01/26/24   [provider]    Family History Family History  Problem Relation Age of Onset   Obesity Mother    Heart disease Father    Sleep apnea Father    Obesity Father     Social History Social History[1]   Allergies   Bupropion   Review of Systems Review of Systems  Constitutional: Negative.   Gastrointestinal:  Negative  for abdominal pain, nausea and vomiting.  Genitourinary:  Positive for vaginal discharge. Negative for decreased urine volume, difficulty urinating, dysuria, flank pain, frequency, genital sores, hematuria, menstrual problem, pelvic pain, urgency, vaginal bleeding and vaginal pain.     Physical Exam Triage Vital Signs ED Triage Vitals  Encounter Vitals Group     BP 12/02/24 1529 117/78     Girls Systolic BP Percentile --      Girls Diastolic BP Percentile --      Boys Systolic BP Percentile --      Boys Diastolic BP Percentile --      Pulse Rate 12/02/24 1529 82     Resp 12/02/24 1529 18     Temp 12/02/24 1529 98.4 F (36.9 C)     Temp Source 12/02/24 1529 Oral     SpO2 12/02/24 1529 98 %     Weight 12/02/24 1529 250 lb (113.4 kg)     Height 12/02/24 1529 5' 9 (1.753 m)     Head Circumference --      Peak Flow --      Pain Score 12/02/24 1533 0     Pain Loc --      Pain Education --      Exclude from Growth Chart --    No data found.  Updated Vital Signs BP 117/78 (BP Location: Right Arm)   Pulse 82   Temp 98.4 F (36.9 C) (Oral)   Resp 18   Ht 5' 9 (1.753 m)   Wt 250 lb (113.4 kg)   LMP 11/15/2024 (Exact Date)   SpO2 98%   BMI 36.92 kg/m   Visual Acuity Right Eye Distance:   Left Eye Distance:   Bilateral Distance:    Right Eye Near:   Left Eye Near:    Bilateral Near:     Physical Exam Vitals and nursing note reviewed.  Constitutional:      General: She is not in acute distress.    Appearance: Normal appearance. She is normal weight. She is not toxic-appearing.  Eyes:     Conjunctiva/sclera: Conjunctivae normal.  Cardiovascular:     Rate and Rhythm: Normal rate and regular rhythm.     Heart sounds: Normal heart sounds.  Pulmonary:     Effort: Pulmonary effort is normal.     Breath sounds: Normal breath sounds and air entry.  Abdominal:     General: Abdomen is flat. Bowel sounds are normal.     Palpations: Abdomen is soft.     Tenderness: There  is no abdominal tenderness. There is no right CVA tenderness or left CVA tenderness.  Skin:    General: Skin is warm and dry.  Neurological:     Mental Status: She is alert and oriented to person, place, and time.  Psychiatric:        Mood and Affect: Mood normal.        Behavior: Behavior normal.      UC Treatments / Results  Labs (all labs ordered are listed, but only abnormal results are displayed) Labs Reviewed - No data to display  EKG   Radiology No results found.  Procedures Procedures (including critical care time)  Medications Ordered in UC Medications - No data to display  Initial Impression / Assessment and Plan / UC Course  I have reviewed the triage vital signs and the nursing notes.  Pertinent labs & imaging results that were available during my care of the patient were reviewed by me and considered in my medical decision making (see chart for details).    I discussed the differential diagnoses, primarily considering a yeast infection due to the patient's history and recent antibiotic use.  She denied concern for any sexually transmitted diseases.  The plan includes prescribing fluconazole , with instructions to take the first dose today and a second dose on Monday if symptoms persist.  I recommended incorporating yogurt, kombucha, or a probiotic supplement to help restore normal flora balance. I suggested opting for any over-the-counter probiotic, as they are generally similar in composition. The patient is advised to monitor symptoms and seek further care if they worsen or do not improve. Return precautions include seeking medical attention if symptoms do not resolve or if new symptoms develop.  Final Clinical Impressions(s) / UC Diagnoses   Final diagnoses:  Candidiasis, vagina     Discharge Instructions      You are being diagnosed with a vaginal yeast infection and treated with fluconazole  150 mg tablet  Take 1 tablet today. Most patients need only  one dose. A second tablet may be taken 72 hours later ONLY if symptoms are not improving or have not resolved. If symptoms are gone after the first dose, do not take the second dose.  What this medication treats: This medication treats a yeast infection caused by an overgrowth of Candida. Common symptoms include vaginal itching, burning, irritation, redness, and thick white discharge.  What to expect: Symptoms usually begin to improve within 1-3 days. Full symptom resolution may take several days. Mild itching or irritation can persist briefly as the tissue heals.  Self-care during treatment: Avoid vaginal douching or scented products. Wear loose-fitting, breathable underwear. Avoid sexual intercourse until symptoms have resolved to prevent irritation.  Possible side effects: Most people tolerate fluconazole  well. Mild nausea, headache, or stomach upset may occur. These usually resolve without treatment.  When to seek medical care: - Symptoms do not improve after completing treatment - Symptoms worsen or become severe - Fever, pelvic pain, or abnormal bleeding occurs - Symptoms return frequently (four or more infections in one year)         ED Prescriptions     Medication Sig Dispense Auth. Provider   fluconazole  (DIFLUCAN ) 150 MG tablet Take 1 tablet (150 mg total) by mouth every 3 (three) days. 2 tablet Leatrice Vernell HERO, NP      PDMP not reviewed this encounter.    [1]  Social History Tobacco Use   Smoking status: Never   Smokeless tobacco: Never  Vaping Use   Vaping status: Never Used  Substance Use Topics   Alcohol use: Yes  Comment: occassionally   Drug use: Never     Leatrice Vernell HERO, NP 12/02/24 1610  "

## 2024-12-02 NOTE — ED Triage Notes (Signed)
 Pt c/o vaginal irritation and thick white discharge for 2-3 days.

## 2024-12-02 NOTE — Discharge Instructions (Signed)
 You are being diagnosed with a vaginal yeast infection and treated with fluconazole  150 mg tablet  Take 1 tablet today. Most patients need only one dose. A second tablet may be taken 72 hours later ONLY if symptoms are not improving or have not resolved. If symptoms are gone after the first dose, do not take the second dose.  What this medication treats: This medication treats a yeast infection caused by an overgrowth of Candida. Common symptoms include vaginal itching, burning, irritation, redness, and thick white discharge.  What to expect: Symptoms usually begin to improve within 1-3 days. Full symptom resolution may take several days. Mild itching or irritation can persist briefly as the tissue heals.  Self-care during treatment: Avoid vaginal douching or scented products. Wear loose-fitting, breathable underwear. Avoid sexual intercourse until symptoms have resolved to prevent irritation.  Possible side effects: Most people tolerate fluconazole  well. Mild nausea, headache, or stomach upset may occur. These usually resolve without treatment.  When to seek medical care: - Symptoms do not improve after completing treatment - Symptoms worsen or become severe - Fever, pelvic pain, or abnormal bleeding occurs - Symptoms return frequently (four or more infections in one year)

## 2024-12-16 ENCOUNTER — Ambulatory Visit: Admitting: Student

## 2025-01-18 ENCOUNTER — Encounter
